# Patient Record
Sex: Female | Born: 1960 | Race: Black or African American | Hispanic: No | Marital: Single | Smoking: Never smoker
Health system: Southern US, Community
[De-identification: ages and names within clinical notes are randomized; demographics above are authoritative.]

## PROBLEM LIST (undated history)

## (undated) DIAGNOSIS — E785 Hyperlipidemia, unspecified: Secondary | ICD-10-CM

## (undated) DIAGNOSIS — N838 Other noninflammatory disorders of ovary, fallopian tube and broad ligament: Secondary | ICD-10-CM

## (undated) DIAGNOSIS — F419 Anxiety disorder, unspecified: Secondary | ICD-10-CM

## (undated) DIAGNOSIS — H409 Unspecified glaucoma: Secondary | ICD-10-CM

## (undated) DIAGNOSIS — I10 Essential (primary) hypertension: Secondary | ICD-10-CM

## (undated) DIAGNOSIS — F32A Depression, unspecified: Secondary | ICD-10-CM

## (undated) DIAGNOSIS — F329 Major depressive disorder, single episode, unspecified: Secondary | ICD-10-CM

## (undated) DIAGNOSIS — B54 Unspecified malaria: Secondary | ICD-10-CM

## (undated) HISTORY — DX: Other noninflammatory disorders of ovary, fallopian tube and broad ligament: N83.8

## (undated) HISTORY — PX: DILATION AND CURETTAGE OF UTERUS: SHX78

## (undated) HISTORY — DX: Essential (primary) hypertension: I10

## (undated) HISTORY — PX: BILATERAL OOPHORECTOMY: SHX1221

## (undated) HISTORY — DX: Hyperlipidemia, unspecified: E78.5

---

## 2008-01-12 ENCOUNTER — Emergency Department (HOSPITAL_COMMUNITY): Admission: EM | Admit: 2008-01-12 | Discharge: 2008-01-13 | Payer: Self-pay | Admitting: Emergency Medicine

## 2008-02-04 ENCOUNTER — Encounter: Payer: Self-pay | Admitting: Internal Medicine

## 2008-02-25 ENCOUNTER — Emergency Department (HOSPITAL_COMMUNITY): Admission: EM | Admit: 2008-02-25 | Discharge: 2008-02-25 | Payer: Self-pay | Admitting: Family Medicine

## 2008-03-02 ENCOUNTER — Emergency Department (HOSPITAL_COMMUNITY): Admission: EM | Admit: 2008-03-02 | Discharge: 2008-03-02 | Payer: Self-pay | Admitting: Psychiatry

## 2008-04-01 ENCOUNTER — Emergency Department (HOSPITAL_COMMUNITY): Admission: EM | Admit: 2008-04-01 | Discharge: 2008-04-01 | Payer: Self-pay | Admitting: Family Medicine

## 2008-09-12 ENCOUNTER — Emergency Department (HOSPITAL_COMMUNITY): Admission: EM | Admit: 2008-09-12 | Discharge: 2008-09-12 | Payer: Self-pay | Admitting: Emergency Medicine

## 2009-02-05 ENCOUNTER — Emergency Department (HOSPITAL_COMMUNITY): Admission: EM | Admit: 2009-02-05 | Discharge: 2009-02-05 | Payer: Self-pay | Admitting: Emergency Medicine

## 2009-05-26 ENCOUNTER — Emergency Department (HOSPITAL_COMMUNITY): Admission: EM | Admit: 2009-05-26 | Discharge: 2009-05-26 | Payer: Self-pay | Admitting: Family Medicine

## 2009-07-27 ENCOUNTER — Emergency Department (HOSPITAL_COMMUNITY): Admission: EM | Admit: 2009-07-27 | Discharge: 2009-07-27 | Payer: Self-pay | Admitting: Emergency Medicine

## 2009-07-30 ENCOUNTER — Ambulatory Visit: Payer: Self-pay | Admitting: Internal Medicine

## 2009-07-30 ENCOUNTER — Telehealth: Payer: Self-pay | Admitting: Internal Medicine

## 2009-07-30 DIAGNOSIS — I10 Essential (primary) hypertension: Secondary | ICD-10-CM | POA: Insufficient documentation

## 2009-07-30 DIAGNOSIS — E119 Type 2 diabetes mellitus without complications: Secondary | ICD-10-CM

## 2009-07-30 DIAGNOSIS — E785 Hyperlipidemia, unspecified: Secondary | ICD-10-CM

## 2009-07-30 LAB — CONVERTED CEMR LAB: Beta hcg, urine, semiquantitative: NEGATIVE

## 2009-07-30 LAB — HM DIABETES FOOT EXAM

## 2009-08-03 ENCOUNTER — Encounter: Payer: Self-pay | Admitting: Internal Medicine

## 2009-08-03 ENCOUNTER — Telehealth: Payer: Self-pay | Admitting: Internal Medicine

## 2009-08-06 ENCOUNTER — Ambulatory Visit: Payer: Self-pay | Admitting: Internal Medicine

## 2009-08-06 DIAGNOSIS — L680 Hirsutism: Secondary | ICD-10-CM

## 2009-08-06 LAB — CONVERTED CEMR LAB
Albumin: 4.5 g/dL (ref 3.5–5.2)
Blood Glucose, Fingerstick: 183
CO2: 25 meq/L (ref 19–32)
Cortisol, Plasma: 6.2 ug/dL
DHEA-SO4: 113 ug/dL (ref 35–430)
Free T4: 1.29 ng/dL (ref 0.80–1.80)
Glucose, Bld: 143 mg/dL — ABNORMAL HIGH (ref 70–99)
Hemoglobin: 14 g/dL (ref 12.0–15.0)
Lymphocytes Relative: 30 % (ref 12–46)
Lymphs Abs: 2.8 10*3/uL (ref 0.7–4.0)
Monocytes Absolute: 0.5 10*3/uL (ref 0.1–1.0)
Monocytes Relative: 5 % (ref 3–12)
Neutro Abs: 6 10*3/uL (ref 1.7–7.7)
Potassium: 4.2 meq/L (ref 3.5–5.3)
RBC: 4.77 M/uL (ref 3.87–5.11)
Sodium: 138 meq/L (ref 135–145)
TSH: 1.838 microintl units/mL (ref 0.350–4.5)
Total Protein: 7.4 g/dL (ref 6.0–8.3)
WBC: 9.6 10*3/uL (ref 4.0–10.5)

## 2009-08-11 ENCOUNTER — Ambulatory Visit (HOSPITAL_COMMUNITY): Admission: RE | Admit: 2009-08-11 | Discharge: 2009-08-11 | Payer: Self-pay | Admitting: Internal Medicine

## 2009-08-14 ENCOUNTER — Ambulatory Visit: Payer: Self-pay | Admitting: Internal Medicine

## 2009-08-14 ENCOUNTER — Other Ambulatory Visit: Payer: Self-pay | Admitting: Dietician

## 2009-08-18 ENCOUNTER — Encounter: Payer: Self-pay | Admitting: Internal Medicine

## 2009-08-18 ENCOUNTER — Telehealth: Payer: Self-pay | Admitting: *Deleted

## 2009-08-20 ENCOUNTER — Telehealth (INDEPENDENT_AMBULATORY_CARE_PROVIDER_SITE_OTHER): Payer: Self-pay | Admitting: *Deleted

## 2009-08-25 ENCOUNTER — Telehealth (INDEPENDENT_AMBULATORY_CARE_PROVIDER_SITE_OTHER): Payer: Self-pay | Admitting: *Deleted

## 2009-09-01 ENCOUNTER — Telehealth: Payer: Self-pay | Admitting: Internal Medicine

## 2009-09-11 ENCOUNTER — Ambulatory Visit: Payer: Self-pay | Admitting: Obstetrics & Gynecology

## 2009-09-11 ENCOUNTER — Encounter: Payer: Self-pay | Admitting: Internal Medicine

## 2009-09-11 LAB — CONVERTED CEMR LAB
ALT: 21 units/L
AST: 18 units/L
Albumin: 4.5 g/dL
Alkaline Phosphatase: 49 units/L
Chlamydia, DNA Probe: NEGATIVE
DHEA-SO4: 113 ug/dL
Free T4: 1.29 ng/dL
GC Probe Amp, Genital: NEGATIVE
MCHC: 34.1 g/dL
Potassium: 4.2 meq/L
RDW: 14.1 %
Sodium: 138 meq/L
Total Protein: 7.4 g/dL

## 2009-09-12 ENCOUNTER — Encounter: Payer: Self-pay | Admitting: Obstetrics & Gynecology

## 2009-09-12 LAB — CONVERTED CEMR LAB
Clue Cells Wet Prep HPF POC: NONE SEEN
Trich, Wet Prep: NONE SEEN
Yeast Wet Prep HPF POC: NONE SEEN

## 2009-09-15 ENCOUNTER — Telehealth: Payer: Self-pay | Admitting: Internal Medicine

## 2009-09-16 ENCOUNTER — Encounter: Payer: Self-pay | Admitting: Internal Medicine

## 2009-10-14 ENCOUNTER — Ambulatory Visit (HOSPITAL_COMMUNITY): Admission: RE | Admit: 2009-10-14 | Discharge: 2009-10-14 | Payer: Self-pay | Admitting: Internal Medicine

## 2009-10-29 ENCOUNTER — Encounter (INDEPENDENT_AMBULATORY_CARE_PROVIDER_SITE_OTHER): Payer: Self-pay | Admitting: Family Medicine

## 2009-10-29 ENCOUNTER — Ambulatory Visit: Payer: Self-pay | Admitting: Obstetrics and Gynecology

## 2009-11-02 DIAGNOSIS — N838 Other noninflammatory disorders of ovary, fallopian tube and broad ligament: Secondary | ICD-10-CM

## 2009-11-02 HISTORY — DX: Other noninflammatory disorders of ovary, fallopian tube and broad ligament: N83.8

## 2009-11-05 ENCOUNTER — Ambulatory Visit (HOSPITAL_COMMUNITY): Admission: RE | Admit: 2009-11-05 | Discharge: 2009-11-05 | Payer: Self-pay | Admitting: Obstetrics and Gynecology

## 2009-11-11 ENCOUNTER — Encounter: Payer: Self-pay | Admitting: Internal Medicine

## 2009-11-11 ENCOUNTER — Telehealth: Payer: Self-pay | Admitting: Internal Medicine

## 2009-11-13 ENCOUNTER — Ambulatory Visit (HOSPITAL_COMMUNITY): Admission: RE | Admit: 2009-11-13 | Discharge: 2009-11-13 | Payer: Self-pay | Admitting: Obstetrics & Gynecology

## 2009-11-13 LAB — HM MAMMOGRAPHY: HM Mammogram: NEGATIVE

## 2009-11-24 ENCOUNTER — Ambulatory Visit: Payer: Self-pay | Admitting: Internal Medicine

## 2009-11-24 ENCOUNTER — Encounter: Payer: Self-pay | Admitting: Internal Medicine

## 2009-11-24 DIAGNOSIS — N9489 Other specified conditions associated with female genital organs and menstrual cycle: Secondary | ICD-10-CM | POA: Insufficient documentation

## 2009-11-24 DIAGNOSIS — H1030 Unspecified acute conjunctivitis, unspecified eye: Secondary | ICD-10-CM | POA: Insufficient documentation

## 2009-11-24 LAB — CONVERTED CEMR LAB: Blood Glucose, Fingerstick: 123

## 2009-11-25 ENCOUNTER — Telehealth: Payer: Self-pay | Admitting: Internal Medicine

## 2009-11-25 LAB — CONVERTED CEMR LAB
Creatinine, Urine: 159.9 mg/dL
HDL: 44 mg/dL (ref >39)
Microalb Creat Ratio: 5.6 mg/g (ref 0.0–30.0)
Microalb, Ur: 0.89 mg/dL (ref 0.00–1.89)

## 2009-12-02 ENCOUNTER — Encounter: Payer: Self-pay | Admitting: Obstetrics & Gynecology

## 2009-12-02 ENCOUNTER — Ambulatory Visit: Payer: Self-pay | Admitting: Obstetrics and Gynecology

## 2009-12-12 ENCOUNTER — Encounter: Payer: Self-pay | Admitting: Obstetrics & Gynecology

## 2009-12-12 LAB — CONVERTED CEMR LAB: FSH: 8.6 milliintl units/mL

## 2009-12-16 ENCOUNTER — Ambulatory Visit: Payer: Self-pay | Admitting: Obstetrics & Gynecology

## 2009-12-16 ENCOUNTER — Encounter: Payer: Self-pay | Admitting: Internal Medicine

## 2009-12-16 LAB — CONVERTED CEMR LAB
Clue Cells Wet Prep HPF POC: NEGATIVE
Estradiol: 18.9 pg/mL
Testosterone: 38.5 ng/dL
WBC, Wet Prep HPF POC: NEGATIVE

## 2009-12-31 ENCOUNTER — Ambulatory Visit: Admission: RE | Admit: 2009-12-31 | Discharge: 2009-12-31 | Payer: Self-pay | Admitting: Gynecologic Oncology

## 2010-01-04 ENCOUNTER — Ambulatory Visit: Payer: Self-pay | Admitting: Obstetrics & Gynecology

## 2010-01-07 ENCOUNTER — Telehealth: Payer: Self-pay | Admitting: *Deleted

## 2010-01-12 ENCOUNTER — Telehealth (INDEPENDENT_AMBULATORY_CARE_PROVIDER_SITE_OTHER): Payer: Self-pay | Admitting: *Deleted

## 2010-01-13 ENCOUNTER — Ambulatory Visit: Payer: Self-pay | Admitting: Internal Medicine

## 2010-01-15 ENCOUNTER — Telehealth: Payer: Self-pay | Admitting: Internal Medicine

## 2010-01-15 ENCOUNTER — Ambulatory Visit: Payer: Self-pay | Admitting: Internal Medicine

## 2010-01-18 ENCOUNTER — Encounter: Payer: Self-pay | Admitting: Internal Medicine

## 2010-01-19 ENCOUNTER — Telehealth: Payer: Self-pay | Admitting: Internal Medicine

## 2010-01-22 ENCOUNTER — Encounter: Payer: Self-pay | Admitting: Internal Medicine

## 2010-01-22 LAB — CONVERTED CEMR LAB
C-Peptide: 3.12 ng/mL
Cortisol, Plasma: 6.2 ug/dL
Prolactin: 6.3 ng/mL
Testosterone: 10 ng/dL

## 2010-01-27 ENCOUNTER — Ambulatory Visit: Payer: Self-pay | Admitting: Obstetrics and Gynecology

## 2010-01-28 ENCOUNTER — Telehealth: Payer: Self-pay | Admitting: Internal Medicine

## 2010-01-28 ENCOUNTER — Encounter: Payer: Self-pay | Admitting: Internal Medicine

## 2010-02-11 ENCOUNTER — Telehealth: Payer: Self-pay | Admitting: *Deleted

## 2010-04-07 ENCOUNTER — Telehealth: Payer: Self-pay | Admitting: Internal Medicine

## 2010-04-25 ENCOUNTER — Encounter: Payer: Self-pay | Admitting: Family Medicine

## 2010-04-29 ENCOUNTER — Telehealth: Payer: Self-pay | Admitting: *Deleted

## 2010-05-04 NOTE — Progress Notes (Signed)
Summary: refill/ hla  Phone Note Refill Request Message from:  Fax from Pharmacy on September 15, 2009 2:09 PM  Refills Requested: Medication #1:  LANTUS SOLOSTAR 100 UNIT/ML SOLN Inject 20 units under the skin every night.   Dosage confirmed as above?Dosage Confirmed   Last Refilled: 5/11 Initial call taken by: Marin Roberts RN,  September 15, 2009 2:10 PM  Follow-up for Phone Call        completed refill, thank you Bexleigh Theriault  Follow-up by: Mliss Sax MD,  September 17, 2009 11:30 PM    Prescriptions: LANTUS SOLOSTAR 100 UNIT/ML SOLN (INSULIN GLARGINE) Inject 20 units under the skin every night.  #1 x 5   Entered and Authorized by:   Mliss Sax MD   Signed by:   Mliss Sax MD on 09/17/2009   Method used:   Faxed to ...       [facility] Department (retail)       [address] [place], [place]  [address]       Ph: [phone]       Fax: [phone]   RxID:   [id number] LANTUS SOLOSTAR 100 UNIT/ML SOLN (INSULIN GLARGINE) Inject 20 units under the skin every night.  #1 x 5   Entered and Authorized by:   Mliss Sax MD   Signed by:   Mliss Sax MD on 09/17/2009   Method used:   Telephoned to ...       [facility] Department (retail)       [address] [place], [place]  [address]       Ph: [phone]       Fax: [phone]   RxID:   [id number]

## 2010-05-04 NOTE — Progress Notes (Signed)
  Phone Note Outgoing Call   Call placed by: Theotis Barrio NT II,  Aug 18, 2009 12:07 PM Call placed to: Patient Details for Reason: GYN APPT Summary of Call: CALLED AND LEFT VOICE MESSAGE FOR PATIENT TO CALL IF SHE H AD QUESTIONS ABOUT THE APPT / JUNE 10, 011 AT 9:15AM / APPT LETTER MAILED TO THE PATIENT. Lela Sturdivant NT II  Aug 18, 2009 12:09 PM

## 2010-05-04 NOTE — Assessment & Plan Note (Signed)
Summary: coming in at 1:30 per Dilpreet Faires/dmr  Is Patient Diabetic? Yes Did you bring your meter with you today? Yes CBG Result 183 Comments after eating a banana o the way here.   Allergies: 1)  ! Morphine 2)  ! Codeine   Complete Medication List: 1)  Metformin Hcl 1000 Mg Tabs (Metformin hcl) .... Take 1 tablet by mouth two times a day 2)  Hydrochlorothiazide 25 Mg Tabs (Hydrochlorothiazide) .... Take 1 tablet by mouth once a day 3)  Lantus Solostar 100 Unit/ml Soln (Insulin glargine) .... Inject 10 units under the skin every night and increase 1 unit each night until fastings blood sugar < 130 mg/dl 4)  Truetrack Test Strp (Glucose blood) .... Use to test blood sugar twice a day- before breakfst and dinner 5)  Lancets 30g Misc (Lancets) .... Use to test blood sugar twice a day 6)  Sure Comfort Pen Needles 31g X 5 Mm Misc (Insulin pen needle) .... Use to inject insulin once a day 7)  Atenolol 50 Mg Tabs (Atenolol) .... Take 1 tablet by mouth once a day  Other Orders: DSMT(Medicare) Individual, 30 Minutes (G0108)  Diabetes Self Management Training  PCP: Mliss Sax MD Date diagnosed with diabetes: 07/03/2009 Diabetes Type: Type 2 insulin Other persons present: no Current smoking Status: never  Diabetes Medications:  Comments: tkaing lantus 11 units at night, metformin gave her diarrhea despite tkaing it with food. She has only been taking 1000 mg/day. Now using her friends;s One Touch Ultra 2 meter and recording results before brekfats and dinner. CBGs are generally higher in PM, range of fasting blood sugars 150s to 180s. 144 yesterday PM was lowest reading. She now has the [employer], [place] he to get supplies there. 25 pen needles provided today for her to be abelt o finichs using what insulin is left in the pen that was given to her last week.   Long Acting  Insulin Type:Lantus  Bedtime Dose: 11    Monitoring Self monitoring blood glucose 2 times a  day Name of Meter  True Track  Recent Episodes of: Requiring Help from another person  Hyperglycemia : No Hypoglycemia: No Severe Hypoglycemia : No      Activity Limitations  Inadequate physical activity Diabetes Disease Process  Discussed today Define diabetes in simple terms: Needs review/assistance   State own type of diabetes: Demonstrates competency    Medications State name-action-dose-duration-side effects-and time to take medication: Needs review/assistanceState appropriate timing of food related to medication: Needs review/assistanceDemonstrates/verbalizes site selection and rotation for injections Needs review/assistanceCorrectly draw up and administer insulin-Byetta-Symlin-glucagon: Demonstrates competencyState insulin adjustment guidelines: Demonstrates competency Nutritional Management Identify what foods most often affect blood glucose: No knowledge    Verbalize importance of controlling food portions: No knowledge   State importance of spacing and not omitting meals and snacks: No knowledge    Monitoring State purpose and frequency of monitoring BG-ketones-HgbA1C  : Needs review/assistance   Perform glucose monitoring/ketone testing and record results correctly: Demonstrates competencyState target blood glucose and HgbA1C goals: Needs review/assistance    Complications State the causes-signs and symptoms and prevention of Hyperglycemia: Needs review/assistanceExplain proper treatment of hyperglycemia: Needs review/assistance   Glucose control and the development/prevention of long-term complications: No knowledge   State benefits-risks-and options for improving blood sugar control: No knowledge    Exercise States importance of exercise: No knowledge   States effect of exercise on blood glucose: No knowledge    Lifestyle changes:Goal setting and Problem solving Develop  strategies to reduce risk factors: No knowledge   Verbalize need for and frequency of health  care follow-up: Needs review/assistance   Identify Family/SO role in managing diabetes: Needs review/assistance    Psychosocial Adjustment Identify how stress affects diabetes & two sources of stress: No knowledge   Name two ways of obtaining support from family/friends: Needs review/assistance   Diabetes Management Education Done: 08/07/2009        Diabetes Self Management Support: has friend also with diabetes, invited her to hae her friend attend sessions with her Follow-up: 1-2  weeks, then monthly until comfortable with her diabetes self care

## 2010-05-04 NOTE — Letter (Signed)
Summary: [facility]   [facility]   Imported By: Margie Billet 08/18/2009 14:53:48  _____________________________________________________________________  External Attachment:    Type:   Image     Comment:   External Document

## 2010-05-04 NOTE — Progress Notes (Signed)
Summary: diabetes support/dmr  Phone Note Call from Patient Call back at Home Phone [phone]   Caller: Patient Summary of Call: patient called to ask if CBg last  night was under 130(hadn't eaten a big lunch) , does she still take her Lantus insulin?  I told her yes- that is why her CBg was in target because she has been taking 20 units lantus daily. That to stay in target range with her blood sugar ( desired) she should continue to take the lantus unless physicain tells her otherwise.

## 2010-05-04 NOTE — Miscellaneous (Signed)
Summary: [facility]   Imported By: Margie Billet 12/24/2009 15:24:59  _____________________________________________________________________  External Attachment:    Type:   Image     Comment:   External Document

## 2010-05-04 NOTE — Progress Notes (Signed)
Summary: nauseated /dmr  Phone Note Call from Patient Call back at [facility] Phone [phone]   Caller: Patient Summary of Call: call left on voicemail after 5pm yesterday. awoke yesterday with nausea, cbg was168- "low", took metformin as usual, had taken 20 cc insulin the night before. has been "'swimming in nausea all day.  please call Initial call taken by: Jamison Neighbor RD,CDE,  Aug 25, 2009 9:21 AM  Follow-up for Phone Call        CDE returned call- left message that I do not think nausea from diabetes. encouragd her to call if still nauseated today.  Follow-up by: Jamison Neighbor RD,CDE,  Aug 25, 2009 9:27 AM  Additional Follow-up for Phone Call Additional follow up Details #1::        Patient left another message sayng sh eis better today. took meidcine wihtout problems and no nausea. made appointment for cde on 09/14/09. Additional Follow-up by: Jamison Neighbor RD,CDE,  Aug 25, 2009 11:25 AM     Appended Document: nauseated /dmr "20cc" insulin is what patient stated meaning her 20 units- this was clarified during phone conversation

## 2010-05-04 NOTE — Progress Notes (Signed)
Summary: phone/gg  Phone Note Call from Patient   Caller: Patient Summary of Call: Pt called in with concerns because she was taken off atenolol  at last visit and started on methyldopa.  She states this was not discussed with her and she wants to remain on  atenolol.  Her HTN is doing well on this med.  She does not feel it will interfer if she does get pregnant. I told her she should come in and talk with the doctor about changing meds and recheck BP.  I don't think she ever got the methyldopa rx filled. Appointment scheduled for next week Initial call taken by: Merrie Roof RN,  January 07, 2010 10:49 AM

## 2010-05-04 NOTE — Progress Notes (Signed)
Summary: phone/gg  Phone Note Call from Patient   Caller: Patient Summary of Call: Pt call with c/o diarrhea. Onset was yesterday.  She had 5 water stools Sunday  and today at 0700, stool just ran out into bed.  No other c/o  She is on metformin ( since march)  which dose was increased 4/28 and on  iinsulin. Can she take pepto-bismol with these meds.  What else can she do? Pt # [id number]  or [phone] Initial call taken by: Merrie Roof RN,  Aug 03, 2009 12:26 PM  Follow-up for Phone Call        I have tried to call her without success but I think she should come in sooner if her diarrhea persists, I would also hold metformin fo now until she is seen in clinic.  thanks  Avari Gelles Follow-up by: Mliss Sax MD,  Aug 03, 2009 4:01 PM     Appended Document: phone/gg Pt informed and will come to clinic this thursday

## 2010-05-04 NOTE — Progress Notes (Signed)
Summary: cannot afford medicine/dmr  Phone Note Outgoing Call   Call placed by: Jamison Neighbor RD,CDE,  July 30, 2009 4:40 PM Summary of Call: patient left message: [facility] prescriptions and it was 200.00. REtunred message nad encourage dhe to get ht Metfomrin an dlopressor if she can afford themand start ASAP> also to hol donthe lantus until I disucss wiht her doctor. The sample we gave her should last until next week.  I Ithought she was going to be given prescriptons to take to Medication assistance program. If she is paying cash- might consider NPH at bedtime instead.will discuss with DR. Aldine Contes.  Follow-up for Phone Call        [facility] called back againand said she can ge the metformin for 4 dollars, but it is 41.00 for 30 days for the blood pressure medicine [facility] she cannot afford this right now- is there a sub to get her by until she can be approved by [employer]- whih she tells me might be a week because of her student status.  will put this on Dr. William Dalton' desktop Follow-up by: Jamison Neighbor RD,CDE,  July 30, 2009 5:17 PM  Additional Follow-up for Phone Call Additional follow up Details #1::        there are plenty of sub but pt does not want to take it due to pregnancy planning and unfortunately in this case options are limited. I will have to discuss this with patient again and let her know that she has to be taking medicines for BP, at least 2 of them and if she wants the ones on $4 list it will have to be Class C or D in terms of pregnancy effects. I will call her to discuss this. Additional Follow-up by: Mliss Sax MD,  July 31, 2009 9:44 AM

## 2010-05-04 NOTE — Progress Notes (Signed)
Summary: Medication  Phone Note Other Incoming   Caller: Diane Daye Summary of Call: Call from Diane Daye-Dr. Leggette's nurse.  Dr. Alen Bleacher has approved for the pt to continue taking her Atenolol.  Pt was told on Friday by D. Daye that Dr. Alen Bleacher had approved the medication. Angelina Ok RN  January 19, 2010 10:04 AM  Initial call taken by: Angelina Ok RN,  January 19, 2010 10:04 AM  Follow-up for Phone Call        Louellen Molder I have already refilled it. thank you iskra Follow-up by: Mliss Sax MD,  January 19, 2010 12:21 PM

## 2010-05-04 NOTE — Progress Notes (Signed)
Summary: refill/gg  Phone Note Refill Request  on November 11, 2009 10:55 AM  Refills Requested: Medication #1:  ATENOLOL 50 MG TABS Take 1 tablet by mouth once a day This med was refilled 4/]29 but never picked up at [facility].  I [facility] wt[facility] only 3 refills.   Method Requested: Electronic Initial call taken by: Merrie Roof RN,  November 11, 2009 10:55 AM

## 2010-05-04 NOTE — Assessment & Plan Note (Signed)
Summary: NEW PATIENT-ER DIAGNOSIS/HIGH BLOOD PRESSURE/DIABETES/CFB   Vital Signs:  Patient profile:   50 year old female Height:      64.75 inches (164.47 cm) Weight:      268.2 pounds (121.91 kg) BMI:     45.14 Temp:     97.9 degrees F (36.61 degrees C) oral Pulse rate:   79 / minute BP sitting:   172 / 105  (right arm)  Vitals Entered By: Stanton Kidney Ditzler RN (July 30, 2009 11:19 AM) Is Patient Diabetic? Yes Did you bring your meter with you today? No Pain Assessment Patient in pain? no      Nutritional Status BMI of > 30 = obese Nutritional Status Detail appetite down CBG Result 236  Have you ever been in a relationship where you felt threatened, hurt or afraid?denies   Does patient need assistance? Functional Status Self care Ambulation Normal Comments New pt to [facility]. Just Dx with high BP and diabetes. Wants to get preg - no GYN dr. 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 IT consultant at [employer].   History of Present Illness: 50 yo female with HTN and DM2 presents today to our clinic as new patient and would like for [place] to be her PCP. She has no concerns at the time. No recent sicknesses or hospitalizaitons. No episodes of chest pain, SOB, palpitations. No specific abdominal or urinary concerns. No recent changes in appetite, weight, sleep patterns, mood.   Her main issue today is desire to get pregnant, she has been trying for several years but has never followed up with BO/Gyn specialist for assistance in the matter. She has never been pregnant and has not had any miscarriges. She has never been on any contraception.   Depression History:      The patient denies a depressed mood most of the day and a diminished interest in her usual daily activities.  The patient denies significant weight loss, significant weight gain, insomnia, hypersomnia, psychomotor agitation, psychomotor retardation, fatigue (loss of energy), feelings of worthlessness (guilt), impaired  concentration (indecisiveness), and recurrent thoughts of death or suicide.        The patient denies that she feels like life is not worth living, denies that she wishes that she were dead, and denies that she has thought about ending her life.         Preventive Screening-Counseling & Management  Alcohol-Tobacco     Smoking Status: never  Caffeine-Diet-Exercise     Does Patient Exercise: yes      Drug Use:  no.    Current Medications (verified): 1)  Metformin Hcl 1000 Mg Tabs (Metformin Hcl) .... Take 1 Tablet By Mouth Two Times A Day 2)  Lopressor Hct 50-25 Mg Tabs (Metoprolol-Hydrochlorothiazide) .... Take 1 Tablet By Mouth Once A Day 3)  Lantus Solostar 100 Unit/ml Soln (Insulin Glargine) .... Inject 10 Units Under The Skin Every Night  Allergies (verified): 1)  ! Morphine 2)  ! Codeine  Past History:  Family History: Last updated: 07/30/2009 no known medical family history  Social History: Last updated: 07/30/2009 Married Alcohol use-no Drug use-no Regular exercise-yes  Risk Factors: Exercise: yes (07/30/2009)  Risk Factors: Smoking Status: never (07/30/2009)  Past Medical History: Diabetes mellitus, type II Hypertension  Family History: Reviewed history and no changes required. no known medical family history  Social History: Reviewed history and no changes required. Married Alcohol use-no Drug use-no Regular exercise-yes Smoking Status:  never Drug Use:  no Does Patient Exercise:  yes  Review of Systems       per HPI  Physical Exam  General:  Well-developed,well-nourished,in no acute distress; alert,appropriate and cooperative throughout examination Lungs:  Normal respiratory effort, chest expands symmetrically. Lungs are clear to auscultation, no crackles or wheezes. Heart:  Normal rate and regular rhythm. S1 and S2 normal without gallop, murmur, click, rub or other extra sounds. Abdomen:  Bowel sounds positive,abdomen soft and non-tender  without masses, organomegaly or hernias noted. Neurologic:  No cranial nerve deficits noted. Station and gait are normal. Plantar reflexes are down-going bilaterally. DTRs are symmetrical throughout. Sensory, motor and coordinative functions appear intact. Psych:  Cognition and judgment appear intact. Alert and cooperative with normal attention span and concentration. No apparent delusions, illusions, hallucinations  Diabetes Management Exam:    Foot Exam (with socks and/or shoes not present):       Sensory-Monofilament:          Left foot: normal          Right foot: normal   Impression & Recommendations:  Problem # 1:  DM (ICD-250.00) Uncontrolled and I think that the main problem here was lack of PCP. Pt has not been clear on who was managing her DM but tells me she ahs been getting perscription from doctor at [facility] [facility] where she goes to school. Her A1C today is 9.8 and we have no comparison for this. The best we can do at this point is to optimize the regimen, follow u pin next week to ensure that she has no hypo/hyperglycemic event, and let her see Jamison Neighbor for additional education. She has been on Metformin 850mg  two times a day dosing but I will increase this to 1000 mg two times a day and will start her on low dose lantus (pen, sample from clinic given). Lupita Leash has agreed to see her today and provide education on self insulin injections and pt seems to be comfortable with this. She is also going to see Jaynee Eagles to get paperwork done for insurance purposes. Since pt plans pregnancy, I will refer her to [facility] specialist for further management. I have explained to the patient risks of first pregnancy given her age and comorbidities, especially noncontrolled ones. She is aware and that might be an issue as well since she is not willing to take medicines in any drug class C, only A or B (which is safe in pregnancy). I have discussed this withher as well and will re-emphasize on future visits as well but  I hope this is also addressed with OB doctor. My advice to patient was to get the medical problems under control first and then make other plans since uncontrolled HTN and DM are exceptionally dangerous conditions for fetus and for mother as well. Pregnancy test today is negative.  Orders: T- Capillary Blood Glucose (16109) T-Hgb A1C (in-house) (60454UJ) T-Urine Pregnancy (in -house) (81191) Diabetic Clinic Referral (Diabetic)  Reviewed HgBA1c results: 9.8 (07/30/2009)  Her updated medication list for this problem includes:    Metformin Hcl 1000 Mg Tabs (Metformin hcl) .Marland Kitchen... Take 1 tablet by mouth two times a day  Problem # 2:  PREGNANCY, HIGH RISK (ICD-V23.9) Negative, referral made to OB.  Orders: Obstetric Referral (Obstetric)  Problem # 3:  HYPERTENSION, UNCONTROLLED (ICD-401.9) Uncontrolled, she wants to stop Lisinopril and I think that is reasonable if she is planning pregnancy but she need multi-drug regimen for BP control. I will give her combo tablet and I have told her that Loressor is Class  C category but it is important to achiece BP control and Lopressor is preferred ove lisinopril in cases where pregnancy is planned. I will see her next week to check BP and CBG and will readjust the regimen.  BP today: 172/105  Her updated medication list for this problem includes:    Lopressor Hct 50-25 Mg Tabs (Metoprolol-hydrochlorothiazide) .Marland Kitchen... Take 1 tablet by mouth once a day  Problem # 4:  HYPERLIPIDEMIA (ICD-272.4) Will hold of on testing since regimen involves statins and pt is not willing to start the medication. I recommend at least testing on her next visit.   Complete Medication List: 1)  Metformin Hcl 1000 Mg Tabs (Metformin hcl) .... Take 1 tablet by mouth two times a day 2)  Lopressor Hct 50-25 Mg Tabs (Metoprolol-hydrochlorothiazide) .... Take 1 tablet by mouth once a day 3)  Lantus Solostar 100 Unit/ml Soln (Insulin glargine) .... Inject 10 units under the skin every  night  Patient Instructions: 1)  Please schedule a follow-up appointment in 3 months. 2)  Please check your blood pressure regularly, if it is >170 please call clinic at [phone] 3)  Please check your sugar levels regularly and remember to bring the meter with you to the next clinic appointment, if the sugars are > 350 or < 60 please call us at [phone] Prescriptions: LANTUS SOLOSTAR 100 UNIT/ML SOLN (INSULIN GLARGINE) Inject 10 units under the skin every night  #1 x 3   Entered and Authorized by:   Mliss Sax MD   Signed by:   Mliss Sax MD on 07/30/2009   Method used:   Electronically to        [facility] [phone]* (retail)       [address]       [place], [place]  [address]       Ph: [phone]       Fax: [phone]   RxID:   [id number] LOPRESSOR HCT 50-25 MG TABS (METOPROLOL-HYDROCHLOROTHIAZIDE) Take 1 tablet by mouth once a day  #30 x 3   Entered and Authorized by:   Mliss Sax MD   Signed by:   Mliss Sax MD on 07/30/2009   Method used:   Electronically to        [facility] [phone]* (retail)       [address]       [place], [place]  [address]       Ph: [phone]       Fax: [phone]   RxID:   [id number] METFORMIN HCL 1000 MG TABS (METFORMIN HCL) Take 1 tablet by mouth two times a day  #60 x 3   Entered and Authorized by:   Mliss Sax MD   Signed by:   Mliss Sax MD on 07/30/2009   Method used:   Electronically to        [facility] [phone]* (retail)       [address]       [place], [place]  [address]       Ph: [phone]       Fax: [phone]   RxID:   [id number]    Diabetic Foot Exam Last Podiatry Exam Date: 07/30/2009  Foot Inspection Is there a history of a foot ulcer?              No Is there a foot ulcer now?              No Is there swelling or an abnormal  foot shape?          No Are the toenails long?                No Are the toenails thick?                No Are the toenails ingrown?               No Is there heavy callous build-up?              No Is there pain in the calf muscle (Intermittent claudication) when walking?    NoIs there a claw toe deformity?              No Is there elevated skin temperature?            No Is there limited ankle dorsiflexion?            No Is there foot or ankle muscle weakness?            No  Diabetic Foot Care Education Patient educated on appropriate care of diabetic feet.   High Risk Feet? No   10-g (5.07) Semmes-Weinstein Monofilament Test           Right Foot          Left Foot Visual Inspection     normal           normal Test Control      normal         normal Site 1         normal         normal Site 2         normal         normal Site 3         normal         normal Site 4         normal         normal Site 5         normal         normal Site 6         normal         normal Site 7         normal         normal Site 8         normal         normal Site 9         normal         normal Site 10         normal         normal  Impression      normal         normal   Prevention & Chronic Care Immunizations   Influenza vaccine: Not documented   Influenza vaccine deferral: Not indicated  (07/30/2009)    Tetanus booster: Not documented   Td booster deferral: Not indicated  (07/30/2009)    Pneumococcal vaccine: Not documented  Other Screening   Pap smear: Not documented   Pap smear action/deferral: Deferred-3 yr interval  (07/30/2009)    Mammogram: Not documented   Mammogram action/deferral: Deferred-2 yr interval  (07/30/2009)   Smoking status: never  (07/30/2009)  Diabetes Mellitus   HgbA1C: 9.8  (07/30/2009)    Eye exam: Not documented   Diabetic eye exam action/deferral: Not indicated  (07/30/2009)    Foot exam: yes  (07/30/2009)  Foot exam action/deferral: Do today   High risk foot: No  (07/30/2009)   Foot care education: Done  (07/30/2009)    Urine microalbumin/creatinine ratio: Not documented     Diabetes flowsheet reviewed?: Yes   Progress toward A1C goal: Unchanged  Lipids   Total Cholesterol: Not documented   LDL: Not documented   LDL Direct: Not documented   HDL: Not documented   Triglycerides: Not documented    SGOT (AST): Not documented   SGPT (ALT): Not documented   Alkaline phosphatase: Not documented   Total bilirubin: Not documented    Lipid flowsheet reviewed?: Yes   Progress toward LDL goal: At goal  Hypertension   Last Blood Pressure: 172 / 105  (07/30/2009)   Serum creatinine: Not documented   BMP action: Not indicated   Serum potassium Not documented    Hypertension flowsheet reviewed?: Yes   Progress toward BP goal: Deteriorated  Self-Management Support :   Personal Goals (by the next clinic visit) :     Personal A1C goal: 7  (07/30/2009)     Personal blood pressure goal: 130/80  (07/30/2009)     Personal LDL goal: 100  (07/30/2009)    Patient will work on the following items until the next clinic visit to reach self-care goals:     Medications and monitoring: take my medicines every day, check my blood pressure, bring all of my medications to every visit  (07/30/2009)     Eating: drink diet soda or water instead of juice or soda, eat more vegetables, use fresh or frozen vegetables, eat foods that are low in salt, eat baked foods instead of fried foods, eat fruit for snacks and desserts, limit or avoid alcohol  (07/30/2009)     Activity: take a 30 minute walk every day, take the stairs instead of the elevator, park at the far end of the parking lot  (07/30/2009)    Diabetes self-management support: Written self-care plan, Education handout, Resources for patients handout  (07/30/2009)   Diabetes care plan printed   Diabetes education handout printed   Referred for diabetes self-mgmt training.    Hypertension self-management support: Written self-care plan, Resources for patients handout  (07/30/2009)   Hypertension self-care plan printed.    Lipid  self-management support: Written self-care plan, Resources for patients handout  (07/30/2009)   Lipid self-care plan printed.      Resource handout printed.   Nursing Instructions: Diabetic foot exam today   Laboratory Results   Urine Tests      Urine HCG: negative Comments: specific gravity  1.024  Blood Tests   Date/Time Received: July 30, 2009 11:42 AM Date/Time Reported: Alric Quan  July 30, 2009 11:42 AM   HGBA1C: 9.8%   (Normal Range: Non-Diabetic - 3-6%   Control Diabetic - 6-8%) CBG Random:: 236mg /dL      Laboratory Results   Urine Tests  Date/Time Received: .Cynda Familia [facility])  July 30, 2009 12:07 PM  Date/Time Reported: .Cynda Familia [facility])  July 30, 2009 12:07 PM     Urine HCG: negative Comments: specific gravity  1.024  Blood Tests     HGBA1C: 9.8%   (Normal Range: Non-Diabetic - 3-6%   Control Diabetic - 6-8%) CBG Random:: 236

## 2010-05-04 NOTE — Progress Notes (Signed)
Summary: testing supplies/dmr  Phone Note Outgoing Call   Call placed by: Jamison Neighbor RD,CDE,  July 30, 2009 3:18 PM Summary of Call: needs testing supply called or faxed to [employer]. please specify if you want me to send or if nurse will do. thanks.  Follow-up for Phone Call        completed refill, thank you Iskra  Follow-up by: Mliss Sax MD,  July 31, 2009 1:37 PM    New/Updated Medications: HYDROCHLOROTHIAZIDE 25 MG TABS (HYDROCHLOROTHIAZIDE) Take 1 tablet by mouth once a day ATENOLOL 50 MG TABS (ATENOLOL) Take 1 tablet by mouth once a day Prescriptions: SURE COMFORT PEN NEEDLES 31G X 5 MM MISC (INSULIN PEN NEEDLE) use to inject insulin once a day  #1 box x 11   Entered and Authorized by:   Mliss Sax MD   Signed by:   Mliss Sax MD on 07/31/2009   Method used:   Faxed to ...       [facility] Department (retail)       [address] [place], [place]  [address]       Ph: [phone]       Fax: [phone]   RxID:   [id number] METFORMIN HCL 1000 MG TABS (METFORMIN HCL) Take 1 tablet by mouth two times a day  #60 x 3   Entered and Authorized by:   Mliss Sax MD   Signed by:   Mliss Sax MD on 07/31/2009   Method used:   Faxed to ...       [facility] Department (retail)       [address] [place], [place]  [address]       Ph: [phone]       Fax: [phone]   RxID:   [id number] LANTUS SOLOSTAR 100 UNIT/ML SOLN (INSULIN GLARGINE) Inject 10 units under the skin every night and increase 1 unit each night until fastings blood sugar < 130 mg/dl  #0 x 0   Entered and Authorized by:   Mliss Sax MD   Signed by:   Mliss Sax MD on 07/31/2009   Method used:   Faxed to ...       [facility] Department (retail)       [address] [place], [place]  [address]       Ph: [phone]       Fax: [phone]   RxID:   [id number] ATENOLOL 50 MG TABS (ATENOLOL) Take 1 tablet by  mouth once a day  #30 x 5   Entered and Authorized by:   Mliss Sax MD   Signed by:   Mliss Sax MD on 07/31/2009   Method used:   Faxed to ...       [facility] Department (retail)       [address] [place], [place]  [address]       Ph: [phone]       Fax: [phone]   RxID:   [id number] HYDROCHLOROTHIAZIDE 25 MG TABS (HYDROCHLOROTHIAZIDE) Take 1 tablet by mouth once a day  #30 x 5   Entered and Authorized by:   Mliss Sax MD   Signed by:   Mliss Sax MD on 07/31/2009   Method used:   Faxed to ...       [facility] Department (retail)  [address] [place], [place]  [address]       Ph: [phone]       Fax: [phone]   RxID:   [id number] LANCETS 30G  MISC (LANCETS) use to test blood sugar twice a day  #100 x 11   Entered and Authorized by:   Mliss Sax MD   Signed by:   Mliss Sax MD on 07/31/2009   Method used:   Faxed to ...       [facility] Department (retail)       [address] [place], [place]  [address]       Ph: [phone]       Fax: [phone]   RxID:   [id number] TRUETRACK TEST  STRP (GLUCOSE BLOOD) use to test blood sugar twice a day- before breakfst and dinner  #100 x 11   Entered and Authorized by:   Mliss Sax MD   Signed by:   Mliss Sax MD on 07/31/2009   Method used:   Faxed to ...       [facility] Department (retail)       [address] [place], [place]  [address]       Ph: [phone]       Fax: [phone]   RxID:   [id number]

## 2010-05-04 NOTE — Progress Notes (Signed)
Summary: refill/gg  Phone Note Refill Request  on January 15, 2010 10:02 AM  Refills Requested: Medication #1:  METHYLDOPA 250 MG TABS Take 1 tablet by mouth two times a day Pt states Dr Penne Lash her GYN wants her to stay on Atenolol !!  Pt is out and wants refill of atenolol not methydopa.  THis is per patient.   Method Requested: Electronic Initial call taken by: Merrie Roof RN,  January 15, 2010 10:02 AM Summary of Call: Pt    Follow-up for Phone Call        I would need some records from them and not sure why they did not send [place] any records already. Would that be possible by any chance? Iskra Thanks Follow-up by: Mliss Sax MD,  January 15, 2010 10:08 AM  Additional Follow-up for Phone Call Additional follow up Details #1::        Sent for records. Merrie Roof RN  January 15, 2010 11:51 AM      Appended Document: refill/gg Records received and placed in Dr Clabe Seal box.

## 2010-05-04 NOTE — Miscellaneous (Signed)
  Clinical Lists Changes  Observations: Added new observation of MAMMO DUE: 11/2010 (01/22/2010 9:46) Added new observation of MAMMOGRAM: No specific mammographic evidence of malignancy.  No significant changes compared to previous study.  Assessment: BIRADS 1.  (11/13/2009 9:47)      Mammogram  Procedure date:  11/13/2009  Findings:      No specific mammographic evidence of malignancy.  No significant changes compared to previous study.  Assessment: BIRADS 1.   Procedures Next Due Date:    Mammogram: 11/2010   Mammogram  Procedure date:  11/13/2009  Findings:      No specific mammographic evidence of malignancy.  No significant changes compared to previous study.  Assessment: BIRADS 1.   Procedures Next Due Date:    Mammogram: 11/2010

## 2010-05-04 NOTE — Letter (Signed)
Summary: [facility]  [facility]   Imported By: Margie Billet 08/06/2009 11:53:57  _____________________________________________________________________  External Attachment:    Type:   Image     Comment:   External Document

## 2010-05-04 NOTE — Progress Notes (Signed)
Summary: diabetes support/dmr  Phone Note Call from Patient Call back at Home Phone [phone]   Caller: Patient Summary of Call: left a message about having bubbles in lantus pen, please call. advised patient to not store pen with needle attached in between injections ( she was leaving it on) also to use the last 2 doses that contained air-  1-she could try to remove the air by doing airshots- explained to patient, 2- she  could discard pen with remianing insulin in it 3- could inject last 2 doses wiht pen completely upside down so air bubbles would be at the top and insulin at the bottom.   support provided and patient encouraged to call as needed. Initial call taken by: Jamison Neighbor RD,CDE,  January 12, 2010 2:11 PM

## 2010-05-04 NOTE — Progress Notes (Signed)
Summary: refill/gg  Phone Note Refill Request  on January 15, 2010 11:54 AM  POLY-DEX 3.5-10000-0.1 SUSP [facility]) apply one drop to each eye q 8 hrs for 5-7days  #1 x 0  This was ordered on 8/23   and pt never picked up.  She is requesting this med.  Will you order?  Left eye irritation   Method Requested: Electronic Initial call taken by: Merrie Roof RN,  January 15, 2010 11:55 AM  Follow-up for Phone Call        completed refill, thank you Iskra  Follow-up by: Mliss Sax MD,  January 15, 2010 12:02 PM    New/Updated Medications: MAXITROL 3.5-10000-0.1 SUSP [facility]) apply one drop to each eye q 8 hrs for 5-7days Prescriptions: MAXITROL 3.5-10000-0.1 SUSP (NEOMYCIN-POLYMYXIN-DEXAMETH) apply one drop to each eye q 8 hrs for 5-7days  #1 x 0   Entered and Authorized by:   Mliss Sax MD   Signed by:   Mliss Sax MD on 01/15/2010   Method used:   Electronically to        [facility] [phone]* (retail)       [address]       [place], [place]  [address]       Ph: [phone]       Fax: [phone]   RxID:   [id number]

## 2010-05-04 NOTE — Assessment & Plan Note (Signed)
Summary: adjust meds/gg   pcp magick   Vital Signs:  Patient profile:   50 year old female Height:      64.75 inches (164.47 cm) Weight:      264.7 pounds (120.32 kg) BMI:     44.55 Temp:     98.9 degrees F (37.17 degrees C) oral Pulse rate:   70 / minute BP sitting:   174 / 102  (right arm) Cuff size:   large  Vitals Entered By: Theotis Barrio NT II (Aug 06, 2009 2:20 PM) CC: MEDICATION ADJUSTMENT  Is Patient Diabetic? No Pain Assessment Patient in pain? no      Nutritional Status BMI of > 30 = obese  Have you ever been in a relationship where you felt threatened, hurt or afraid?No   Does patient need assistance? Functional Status Self care, Social activities Ambulation Normal Comments MEDICATION ADJUTMENT   Primary Care Provider:  Mliss Sax MD  CC:  MEDICATION ADJUSTMENT .  History of Present Illness: 50 yo female with HTN and DM2 presents to the clinic for a follow up. Patient reports that she was started on Metformin in march 2011 and reports that she has had diarrhea about 1 week after starting the metformin. She has been having diarrhea since march. Her metformin was increased to 1000 mg two times a day but was told to take once a day. She reports that her diarrhea is a whole lot better with once a day metformin and is having normal BM.  She reports compliance to all her medications. She denies any other complaints.       Preventive Screening-Counseling & Management  Alcohol-Tobacco     Smoking Status: never  Caffeine-Diet-Exercise     Does Patient Exercise: yes  Problems Prior to Update: 1)  Hyperlipidemia  (ICD-272.4) 2)  Hypertension  (ICD-401.9) 3)  Diabetes Mellitus, Type II  (ICD-250.00) 4)  Dm  (ICD-250.00) 5)  Hypertension, Uncontrolled  (ICD-401.9) 6)  Pregnancy, High Risk  (ICD-V23.9)  Medications Prior to Update: 1)  Metformin Hcl 1000 Mg Tabs (Metformin Hcl) .... Take 1 Tablet By Mouth Two Times A Day 2)  Hydrochlorothiazide 25 Mg  Tabs (Hydrochlorothiazide) .... Take 1 Tablet By Mouth Once A Day 3)  Lantus Solostar 100 Unit/ml Soln (Insulin Glargine) .... Inject 10 Units Under The Skin Every Night and Increase 1 Unit Each Night Until Fastings Blood Sugar < 130 Mg/dl 4)  Truetrack Test  Strp (Glucose Blood) .... Use To Test Blood Sugar Twice A Day- Before Breakfst and Dinner 5)  Lancets 30g  Misc (Lancets) .... Use To Test Blood Sugar Twice A Day 6)  Sure Comfort Pen Needles 31g X 5 Mm Misc (Insulin Pen Needle) .... Use To Inject Insulin Once A Day 7)  Atenolol 50 Mg Tabs (Atenolol) .... Take 1 Tablet By Mouth Once A Day  Current Medications (verified): 1)  Metformin Hcl 1000 Mg Tabs (Metformin Hcl) .... Take 1 Tablet By Mouth Two Times A Day 2)  Hydrochlorothiazide 25 Mg Tabs (Hydrochlorothiazide) .... Take 1 Tablet By Mouth Once A Day 3)  Lantus Solostar 100 Unit/ml Soln (Insulin Glargine) .... Inject 10 Units Under The Skin Every Night and Increase 1 Unit Each Night Until Fastings Blood Sugar < 130 Mg/dl 4)  Truetrack Test  Strp (Glucose Blood) .... Use To Test Blood Sugar Twice A Day- Before Breakfst and Dinner 5)  Lancets 30g  Misc (Lancets) .... Use To Test Blood Sugar Twice A Day 6)  Sure Comfort  Pen Needles 31g X 5 Mm Misc (Insulin Pen Needle) .... Use To Inject Insulin Once A Day 7)  Atenolol 50 Mg Tabs (Atenolol) .... Take 1 Tablet By Mouth Once A Day  Allergies (verified): 1)  ! Morphine 2)  ! Codeine  Past History:  Family History: Last updated: 07/30/2009 no known medical family history  Social History: Last updated: 07/30/2009 Married Alcohol use-no Drug use-no Regular exercise-yes  Risk Factors: Exercise: yes (08/06/2009)  Risk Factors: Smoking Status: never (08/06/2009)  Past Medical History: Reviewed history from 07/30/2009 and no changes required. Diabetes mellitus, type II Hypertension  Social History: Reviewed history from 07/30/2009 and no changes required. Married Alcohol  use-no Drug use-no Regular exercise-yes  Review of Systems      See HPI  Physical Exam  General:  Morbidly obese, NAD.Female pattern hair over the face. Frontal balding noted. Central obesity + Head:  normocephalic and atraumatic.   Mouth:  Oral mucosa and oropharynx without lesions or exudates.  Teeth in good repair. Neck:  thick neck, no palpable thyroids. Lungs:  Normal respiratory effort, chest expands symmetrically. Lungs are clear to auscultation, no crackles or wheezes. Heart:  Normal rate and regular rhythm. S1 and S2 normal without gallop, murmur, click, rub or other extra sounds. Abdomen:  Bowel sounds positive Pulses:  R radial normal.   Extremities:  No clubbing, cyanosis, edema, or deformity noted with normal full range of motion of all joints.   Neurologic:  alert & oriented X3, strength normal in all extremities, and gait normal.     Impression & Recommendations:  Problem # 1:  DIABETES MELLITUS, TYPE II (ICD-250.00)  Reports twice a day metformin was causing her diarrhea. She seems to be tolerating once a day metformin well. Will leave her on once daily metformin. Patient is currently taking 11 units of lantus. Her fasting CBG are [id number], 198. Will increase her Lantus to 15 units a day. Will follow up in a month. Her updated medication list for this problem includes:    Metformin Hcl 1000 Mg Tabs (Metformin hcl) .Marland Kitchen... Take 1 tablet by mouth once a day    Lantus Solostar 100 Unit/ml Soln (Insulin glargine) ..... Inject 15 units under the skin every night.Orders: T-Comprehensive Metabolic Panel [phone]) T-FSH [phone]) T-LH [phone]) T-TSH [phone]) T-Testosterone; Total [phone]) T-T4, Free [phone]) T- * Misc. Laboratory test [phone]) T-Ultrasound Abdominal, Complete [phone]) T-C-Peptide [phone]) T-Dehydroepiandrosterone-Sulfate (DHEA S) [phone]) T-Prolactin [phone]) T-Cortisol Total [phone]) T-CBC w/Diff  [phone])  Problem # 2:  HYPERTENSION (ICD-401.9) BP still running high. Repeat Manual BP is 158/98. No ACEI given that she is trying to get pregnant. Will increase her HCTZ to 50 once daily. Advised her to discuss with her Ob regarding the medicines that she is on for HTN.  The following medications were removed from the medication list:    Hydrochlorothiazide 25 Mg Tabs (Hydrochlorothiazide) .Marland Kitchen... Take 1 tablet by mouth once a day Her updated medication list for this problem includes:    Atenolol 50 Mg Tabs (Atenolol) .Marland Kitchen... Take 1 tablet by mouth once a day    Hydrochlorothiazide 50 Mg Tabs (Hydrochlorothiazide) .Marland Kitchen... Take 1 tablet by mouth once a day  BP today: 174/102 Prior BP: 172/105 (07/30/2009)  Problem # 3:  PREGNANCY, HIGH RISK (ICD-V23.9) Hasn't had babies so far. See my assessment on Hirsutism.  Problem # 4:  HIRSUTISM (ICD-704.1)  She has a lot of worrying features on physical exam. First of all she hasn't had period for many years and she  complains of female pattern hair growth on her face, female pattern frontal balding, central obesity and a new onset diabetes requiring insulin. She reports abnormal menstrual cycles at this age. She does fulfill diagnostic criteria for PCOS ( Cutaneous signs of hyperandrogenism (eg, hirsutism, severe acne, and/or pattern alopecia, Menstrual irregularity (eg, oligo- or amenorrhea, or irregular bleeding),  Obesity and insulin resistance. Will get an [place] abd/pelvis to rule out ovarian/adrenal pathology. Will check the following harmones levels to R/O other explanations. Will follow up next week.   Orders: T-Comprehensive Metabolic Panel [phone]) T-FSH [phone]) T-LH [phone]) T-TSH [phone]) T-Testosterone; Total [phone]) T-T4, Free [phone]) T- * Misc. Laboratory test [phone]) T-Ultrasound Abdominal, Complete [phone]) T-C-Peptide [phone]) T-Dehydroepiandrosterone-Sulfate (DHEA S) [phone]) T-Prolactin  [phone]) T-Cortisol Total [phone]) T-CBC w/Diff [phone])   Orders: T-Comprehensive Metabolic Panel [id number]) T-FSH [phone]) T-LH [phone]) T-TSH [phone]) T-Testosterone; Total [phone]) T-T4, Free [phone]) T- * Misc. Laboratory test [phone]) T-Ultrasound Abdominal, Complete [phone]) T-C-Peptide [phone]) T-Dehydroepiandrosterone-Sulfate (DHEA S) [phone]) T-Prolactin [phone]) T-Cortisol Total [phone]) T-CBC w/Diff [phone])    Complete Medication List: 1)  Metformin Hcl 1000 Mg Tabs (Metformin hcl) .... Take 1 tablet by mouth once a day 2)  Lantus Solostar 100 Unit/ml Soln (Insulin glargine) .... Inject 15 units under the skin every night. 3)  Truetrack Test Strp (Glucose blood) .... Use to test blood sugar twice a day- before breakfst and dinner 4)  Lancets 30g Misc (Lancets) .... Use to test blood sugar twice a day 5)  Sure Comfort Pen Needles 31g X 5 Mm Misc (Insulin pen needle) .... Use to inject insulin once a day 6)  Atenolol 50 Mg Tabs (Atenolol) .... Take 1 tablet by mouth once a day 7)  Hydrochlorothiazide 50 Mg Tabs (Hydrochlorothiazide) .... Take 1 tablet by mouth once a day T-Comprehensive Metabolic Panel [phone]) T-FSH [phone]) T-LH [phone]) T-TSH [phone]) T-Testosterone; Total [phone]) T-T4, Free [phone]) T- * Misc. Laboratory test [phone]) T-Ultrasound Abdominal, Complete [phone]) T-C-Peptide [phone]) T-Dehydroepiandrosterone-Sulfate (DHEA S) [phone]) T-Prolactin [phone]) T-Cortisol Total [phone]) T-CBC w/Diff [phone])  Patient Instructions: 1)  Please schedule a follow-up appointment on May 13th, 2011. 2)  Take all your medications as advised below. Prescriptions: LANTUS SOLOSTAR 100 UNIT/ML SOLN (INSULIN GLARGINE) Inject 15 units under the skin every night.  #0 x 0   Entered and Authorized by:   Blondell Reveal MD   Signed by:   Blondell Reveal MD on 08/06/2009   Method used:   Print then Give to Patient   RxID:   [id number] HYDROCHLOROTHIAZIDE 50 MG TABS (HYDROCHLOROTHIAZIDE) Take 1 tablet by mouth once a day  #30 x 5   Entered and Authorized by:   Blondell Reveal MD   Signed by:   Blondell Reveal MD on 08/06/2009   Method used:   Print then Give to Patient   RxID:   [id number]  Process Orders Check Orders Results:     [facility]: ABN not required for this insurance Tests Sent for requisitioning (Aug 06, 2009 3:48 PM):     08/06/2009: [facility] -- T-Comprehensive Metabolic Panel [[id number]] (signed)     08/06/2009: [facility] -- T-FSH [phone] (signed)     08/06/2009: [facility] -- T-LH [phone] (signed)     08/06/2009: [facility] -- T-TSH [phone] (signed)     08/06/2009: [facility] -- T-Testosterone; Total [phone] (signed)     08/06/2009: [facility] -- T-T4, [place] [[id number]] (signed)     08/06/2009: [facility] -- T- *  Misc. Laboratory test [phone] (signed)     08/06/2009: [facility] -- T-C-Peptide [[id number]] (signed)     08/06/2009: [facility] -- T-Dehydroepiandrosterone-Sulfate (DHEA S) [[id number]] (signed)     08/06/2009: [facility] -- T-Prolactin [phone] (signed)     08/06/2009: [facility] -- T-Cortisol Total [[id number]] (signed)     08/06/2009: [facility] -- T-CBC w/Diff [[id number]] (signed)    Prevention & Chronic Care Immunizations   Influenza vaccine: Not documented   Influenza vaccine deferral: Not indicated  (07/30/2009)    Tetanus booster: Not documented   Td booster deferral: Not indicated  (07/30/2009)    Pneumococcal vaccine: Not documented  Other Screening   Pap smear: Not documented   Pap smear action/deferral: Deferred-3 yr  interval  (07/30/2009)    Mammogram: Not documented   Mammogram action/deferral: Deferred-2 yr interval  (07/30/2009)   Smoking status: never  (08/06/2009)  Diabetes Mellitus   HgbA1C: 9.8  (07/30/2009)    Eye exam: Not documented   Diabetic eye exam action/deferral: Not indicated  (07/30/2009)    Foot exam: yes  (07/30/2009)   Foot exam action/deferral: Do today   High risk foot: No  (07/30/2009)   Foot care education: Done  (07/30/2009)    Urine microalbumin/creatinine ratio: Not documented  Lipids   Total Cholesterol: Not documented   LDL: Not documented   LDL Direct: Not documented   HDL: Not documented   Triglycerides: Not documented    SGOT (AST): Not documented   SGPT (ALT): Not documented CMP ordered    Alkaline phosphatase: Not documented   Total bilirubin: Not documented  Hypertension   Last Blood Pressure: 174 / 102  (08/06/2009)   Serum creatinine: Not documented   BMP action: Not indicated   Serum potassium Not documented CMP ordered   Self-Management Support :   Personal Goals (by the next clinic visit) :     Personal A1C goal: 7  (07/30/2009)     Personal blood pressure goal: 130/80  (07/30/2009)     Personal LDL goal: 100  (07/30/2009)    Patient will work on the following items until the next clinic visit to reach self-care goals:     Medications and monitoring: take my medicines every day, check my blood sugar, bring all of my medications to every visit, examine my feet every day  (08/06/2009)     Eating: drink diet soda or water instead of juice or soda, eat more vegetables, use fresh or frozen vegetables, eat foods that are low in salt, eat baked foods instead of fried foods, eat fruit for snacks and desserts, limit or avoid alcohol  (08/06/2009)     Activity: join a walking program  (08/06/2009)    Diabetes self-management support: Written self-care plan, Education handout, Resources for patients handout  (07/30/2009)   Last diabetes  self-management training by diabetes educator: 07/30/2009    Hypertension self-management support: Written self-care plan, Resources for patients handout  (07/30/2009)    Lipid self-management support: Written self-care plan, Resources for patients handout  (07/30/2009)     Self-management comments: PATIENT STATES SHE HAS JOINED A GYM     Impression & Recommendations:   Her updated medication list for this problem includes:    Metformin Hcl 1000 Mg Tabs (Metformin hcl) .Marland Kitchen... Take 1 tablet by mouth once a day    Lantus Solostar 100 Unit/ml Soln (Insulin glargine) ..... Inject 15 units under the skin every night.  Orders: T-Comprehensive Metabolic Panel [phone]) T-FSH [phone]) T-LH [id number]) T-TSH [phone]) T-Testosterone; Total [phone]) T-T4, Free [phone]) T- * Misc. Laboratory test [phone]) T-Ultrasound Abdominal, Complete [phone]) T-C-Peptide [phone]) T-Dehydroepiandrosterone-Sulfate (DHEA S) [phone]) T-Prolactin [phone]) T-Cortisol Total [phone]) T-CBC w/Diff [phone])                 The following medications were removed from the medication list:    Hydrochlorothiazide 25 Mg Tabs (Hydrochlorothiazide) .Marland Kitchen... Take 1 tablet by mouth once a day  Her updated medication list for this problem includes:    Atenolol 50 Mg Tabs (Atenolol) .Marland Kitchen... Take 1 tablet by mouth once a day    Hydrochlorothiazide 50 Mg Tabs (Hydrochlorothiazide) .Marland Kitchen... Take 1 tablet by mouth once a day  The following medications were removed from the medication list:    Hydrochlorothiazide 25 Mg Tabs (Hydrochlorothiazide) .Marland Kitchen... Take 1 tablet by mouth once a day  Her updated medication list for this problem includes:    Atenolol 50 Mg Tabs (Atenolol) .Marland Kitchen... Take 1 tablet by mouth once a day    Hydrochlorothiazide 50 Mg Tabs (Hydrochlorothiazide) .Marland Kitchen... Take 1 tablet by mouth once a  day                Orders: T-Comprehensive Metabolic Panel [phone]) T-FSH [phone]) T-LH [phone]) T-TSH [phone]) T-Testosterone; Total [phone]) T-T4, Free [phone]) T- * Misc. Laboratory test [phone]) T-Ultrasound Abdominal, Complete [phone]) T-C-Peptide [phone]) T-Dehydroepiandrosterone-Sulfate (DHEA S) [phone]) T-Prolactin [phone]) T-Cortisol Total [phone]) T-CBC w/Diff [phone])                Complete Medication List: 1)  Metformin Hcl 1000 Mg Tabs (Metformin hcl) .... Take 1 tablet by mouth once a day 2)  Lantus Solostar 100 Unit/ml Soln (Insulin glargine) .... Inject 15 units under the skin every night. 3)  Truetrack Test Strp (Glucose blood) .... Use to test blood sugar twice a day- before breakfst and dinner 4)  Lancets 30g Misc (Lancets) .... Use to test blood sugar twice a day 5)  Sure Comfort Pen Needles 31g X 5 Mm Misc (Insulin pen needle) .... Use to inject insulin once a day 6)  Atenolol 50 Mg Tabs (Atenolol) .... Take 1 tablet by mouth once a day 7)  Hydrochlorothiazide 50 Mg Tabs (Hydrochlorothiazide) .... Take 1 tablet by mouth once a day    Process Orders Check Orders Results:     [facility]: ABN not required for this insurance Tests Sent for requisitioning (Aug 06, 2009 3:48 PM):     08/06/2009: [facility] -- T-Comprehensive Metabolic Panel [[id number]] (signed)     08/06/2009: [facility] -- T-FSH [phone] (signed)     08/06/2009: [facility] -- T-LH [phone] (signed)     08/06/2009: [facility] -- T-TSH [phone] (signed)     08/06/2009: [facility] -- T-Testosterone; Total [phone] (signed)     08/06/2009: [facility] -- T-T4, [place] [[id number]] (signed)     08/06/2009: [facility] -- T- * Misc. Laboratory test [[id number]] (signed)      08/06/2009: [facility] -- T-C-Peptide [[id number]] (signed)     08/06/2009: [facility] -- T-Dehydroepiandrosterone-Sulfate [facility] S) [[id number]] (signed)     08/06/2009: [facility] -- T-Prolactin [phone] (signed)     08/06/2009: [facility] -- T-Cortisol Total [[id number]] (signed)     08/06/2009: [facility] -- [facility] w/Diff [[id number]] (signed)

## 2010-05-04 NOTE — Miscellaneous (Signed)
  Clinical Lists Changes  Observations: Added new observation of MRI: Solid mildly T2 hypointense bilateral ovarian lesions. Findings suspicious for bilateral fibroma, differential includes Brenner tumors. (11/12/2009 9:50) Added new observation of PELVIS US: Persistant 3 cm lesion in the R ovary suspicious for endometrioma with cystic ovarian neoplasm considered less likely. Endometrioma may be present in the left ovary but this is less visualized. , pelvic MRI recommended.  (10/14/2009 9:53)      MRI EXAM  Procedure date:  11/12/2009  Findings:      Solid mildly T2 hypointense bilateral ovarian lesions. Findings suspicious for bilateral fibroma, differential includes Brenner tumors.  Pelvic US  Procedure date:  10/14/2009  Findings:      Persistant 3 cm lesion in the R ovary suspicious for endometrioma with cystic ovarian neoplasm considered less likely. Endometrioma may be present in the left ovary but this is less visualized. , pelvic MRI recommended.    MRI EXAM  Procedure date:  11/12/2009  Findings:      Solid mildly T2 hypointense bilateral ovarian lesions. Findings suspicious for bilateral fibroma, differential includes Brenner tumors.  Pelvic US  Procedure date:  10/14/2009  Findings:      Persistant 3 cm lesion in the R ovary suspicious for endometrioma with cystic ovarian neoplasm considered less likely. Endometrioma may be present in the left ovary but this is less visualized. , pelvic MRI recommended.

## 2010-05-04 NOTE — Assessment & Plan Note (Signed)
Summary: FLU SHOT/CH  Nurse Visit   Allergies: 1)  ! Morphine 2)  ! Codeine  Orders Added: 1)  Admin 1st Vaccine [90471] 2)  Flu Vaccine 91yrs + [16109] Flu Vaccine Consent Questions     Do you have a history of severe allergic reactions to this vaccine? no    Any prior history of allergic reactions to egg and/or gelatin? no    Do you have a sensitivity to the preservative Thimersol? no    Do you have a past history of Guillan-Barre Syndrome? no    Do you currently have an acute febrile illness? no    Have you ever had a severe reaction to latex? no    Vaccine information given and explained to patient? yes    Are you currently pregnant? no    Lot Number:[id number]   Exp Date:10/02/2010   Manufacturer: [employer]    Site Given  Left Deltoid I.Kaye Goldston,CMA (AAMA)  January 15, 2010 11:55 AM M .Glorious Peach

## 2010-05-04 NOTE — Assessment & Plan Note (Signed)
Summary: ACUTE/MAGICK/PER Brenda Bowman WANTS PT BACK IN A WEEK/CH   Vital Signs:  Patient profile:   50 year old female Height:      64.75 inches (164.47 cm) Weight:      267.1 pounds (120.32 kg) BMI:     44.55 Temp:     97.3 degrees F (36.28 degrees C) oral Pulse rate:   68 / minute BP sitting:   152 / 84  (left arm) Cuff size:   large  Vitals Entered By: Theotis Barrio NT II (Aug 14, 2009 11:04 AM) CC: 1 WEEK FOLLOW UP / RESULTS OF U/S AND BLOOD WORK  / NEED CREAM FOR ? RASH UNDER BREAST Is Patient Diabetic? Yes Did you bring your meter with you today? Yes Pain Assessment Patient in pain? no      Nutritional Status BMI of > 30 = obese  Have you ever been in a relationship where you felt threatened, hurt or afraid?No   Does patient need assistance? Functional Status Self care Ambulation Normal Comments 1 WEEK FOLLOW UP  /  RESULTS OF U/S AND BL BLOOD WORK   / NEEED CREAM FOR ? RASH UNDER BREAST   Primary Care Provider:  Mliss Sax MD  CC:  1 WEEK FOLLOW UP / RESULTS OF U/S AND BLOOD WORK  / NEED CREAM FOR ? RASH UNDER BREAST.  History of Present Illness: 50 yo female with HTN and DM2 presents to the clinic for a follow up of her previous visit. Patient underwent US-abdomen and pevis for ovarian pathology and is here for a follow up on her all labs and [place]. She reports that she has been having periods for the last one year and she thinks that they have been regular.  She denies any other complaints.      Preventive Screening-Counseling & Management  Alcohol-Tobacco     Smoking Status: never  Caffeine-Diet-Exercise     Does Patient Exercise: yes  Problems Prior to Update: 1)  Hirsutism  (ICD-704.1) 2)  Hyperlipidemia  (ICD-272.4) 3)  Hypertension  (ICD-401.9) 4)  Diabetes Mellitus, Type II  (ICD-250.00) 5)  Dm  (ICD-250.00) 6)  Hypertension, Uncontrolled  (ICD-401.9) 7)  Pregnancy, High Risk  (ICD-V23.9)  Medications Prior to Update: 1)  Metformin Hcl 1000  Mg Tabs (Metformin Hcl) .... Take 1 Tablet By Mouth Once A Day 2)  Lantus Solostar 100 Unit/ml Soln (Insulin Glargine) .... Inject 15 Units Under The Skin Every Night. 3)  Truetrack Test  Strp (Glucose Blood) .... Use To Test Blood Sugar Twice A Day- Before Breakfst and Dinner 4)  Lancets 30g  Misc (Lancets) .... Use To Test Blood Sugar Twice A Day 5)  Sure Comfort Pen Needles 31g X 5 Mm Misc (Insulin Pen Needle) .... Use To Inject Insulin Once A Day 6)  Atenolol 50 Mg Tabs (Atenolol) .... Take 1 Tablet By Mouth Once A Day 7)  Hydrochlorothiazide 50 Mg Tabs (Hydrochlorothiazide) .... Take 1 Tablet By Mouth Once A Day  Current Medications (verified): 1)  Metformin Hcl 1000 Mg Tabs (Metformin Hcl) .... Take 1 Tablet By Mouth Once A Day 2)  Lantus Solostar 100 Unit/ml Soln (Insulin Glargine) .... Inject 15 Units Under The Skin Every Night. 3)  Truetrack Test  Strp (Glucose Blood) .... Use To Test Blood Sugar Twice A Day- Before Breakfst and Dinner 4)  Lancets 30g  Misc (Lancets) .... Use To Test Blood Sugar Twice A Day 5)  Sure Comfort Pen Needles 31g X 5 Mm Misc (Insulin  Pen Needle) .... Use To Inject Insulin Once A Day 6)  Atenolol 50 Mg Tabs (Atenolol) .... Take 1 Tablet By Mouth Once A Day 7)  Hydrochlorothiazide 50 Mg Tabs (Hydrochlorothiazide) .... Take 1 Tablet By Mouth Once A Day  Allergies (verified): 1)  ! Morphine 2)  ! Codeine  Past History:  Family History: Last updated: 07/30/2009 no known medical family history  Social History: Last updated: 07/30/2009 Married Alcohol use-no Drug use-no Regular exercise-yes  Risk Factors: Exercise: yes (08/14/2009)  Risk Factors: Smoking Status: never (08/14/2009)  Past Medical History: Reviewed history from 07/30/2009 and no changes required. Diabetes mellitus, type II Hypertension  Family History: Reviewed history from 07/30/2009 and no changes required. no known medical family history  Social History: Reviewed history  from 07/30/2009 and no changes required. Married Alcohol use-no Drug use-no Regular exercise-yes  Review of Systems      See HPI  Physical Exam  General:  Morbidly obese, NAD.Female pattern hair over the face. Frontal balding noted. Central obesity + Head:  normocephalic and atraumatic.   Neck:  thick neck, no palpable thyroids. Lungs:  Normal respiratory effort, chest expands symmetrically. Lungs are clear to auscultation, no crackles or wheezes. Heart:  Normal rate and regular rhythm. S1 and S2 normal without gallop, murmur, click, rub or other extra sounds. Abdomen:  Bowel sounds positive Pulses:  R radial normal.   Extremities:  No clubbing, cyanosis, edema, or deformity noted with normal full range of motion of all joints.   Neurologic:  alert & oriented X3 and cranial nerves II-XII intact.     Impression & Recommendations:  Problem # 1:  DIABETES MELLITUS, TYPE II (ICD-250.00) Fasting blood sugars consistently elevated at >150's. Will increase her Lantus to 20 units and will follow up in a month. Patient could not tolerate metformin 1000 two times a day.   Her updated medication list for this problem includes:    Metformin Hcl 1000 Mg Tabs (Metformin hcl) .Marland Kitchen... Take 1 tablet by mouth once a day    Lantus Solostar 100 Unit/ml Soln (Insulin glargine) ..... Inject 20 units under the skin every night.  Problem # 2:  HYPERTENSION (ICD-401.9) Increased her HCTZ last week. Patient is currently exercising and trying to lose weight and wants to hold on starting another BP medicine. ACE-I is not an option anyway, with her planning to conceive. Continue curent regimen.  Her updated medication list for this problem includes:    Atenolol 50 Mg Tabs (Atenolol) .Marland Kitchen... Take 1 tablet by mouth once a day    Hydrochlorothiazide 50 Mg Tabs (Hydrochlorothiazide) .Marland Kitchen... Take 1 tablet by mouth once a day  BP today: 152/84 Prior BP: 174/102 (08/06/2009)  Labs Reviewed: K+: 4.2  (08/06/2009) Creat: : 0.85 (08/06/2009)     Problem # 3:  HIRSUTISM (ICD-704.1)  All the labs, [place] reviewed. FSH, LH are at the lower limits of normal and her Free testosterone, total are within normal limits. US pelvis reveals a a 2.7 cm right sided ovarian hemorrhagic cystic lesion. Not sure if this could explain all her symptoms. Will try to make a closer Gyn appointment. Continue current management for now.  Future Orders: T-Ultrasound Abdominal, Complete ([address]) ... 10/08/2009  Complete Medication List: 1)  Metformin Hcl 1000 Mg Tabs (Metformin hcl) .... Take 1 tablet by mouth once a day 2)  Lantus Solostar 100 Unit/ml Soln (Insulin glargine) .... Inject 20 units under the skin every night. 3)  Truetrack Test Strp (Glucose blood) .... Use to  test blood sugar twice a day- before breakfst and dinner 4)  Lancets 30g Misc (Lancets) .... Use to test blood sugar twice a day 5)  Sure Comfort Pen Needles 31g X 5 Mm Misc (Insulin pen needle) .... Use to inject insulin once a day 6)  Atenolol 50 Mg Tabs (Atenolol) .... Take 1 tablet by mouth once a day 7)  Hydrochlorothiazide 50 Mg Tabs (Hydrochlorothiazide) .... Take 1 tablet by mouth once a day  Patient Instructions: 1)  Please schedule a follow-up appointment in 1 month. 2)  Take all the medications as advised below.   Prevention & Chronic Care Immunizations   Influenza vaccine: Not documented   Influenza vaccine deferral: Not indicated  (07/30/2009)    Tetanus booster: Not documented   Td booster deferral: Not indicated  (07/30/2009)    Pneumococcal vaccine: Not documented  Other Screening   Pap smear: Not documented   Pap smear action/deferral: Deferred-3 yr interval  (07/30/2009)    Mammogram: Not documented   Mammogram action/deferral: Deferred-2 yr interval  (07/30/2009)   Smoking status: never  (08/14/2009)  Diabetes Mellitus   HgbA1C: 9.8  (07/30/2009)    Eye exam: Not documented   Diabetic eye exam  action/deferral: Not indicated  (07/30/2009)    Foot exam: yes  (07/30/2009)   Foot exam action/deferral: Do today   High risk foot: No  (07/30/2009)   Foot care education: Done  (07/30/2009)    Urine microalbumin/creatinine ratio: Not documented  Lipids   Total Cholesterol: Not documented   LDL: Not documented   LDL Direct: Not documented   HDL: Not documented   Triglycerides: Not documented    SGOT (AST): 18  (08/06/2009)   SGPT (ALT): 21  (08/06/2009)   Alkaline phosphatase: 49  (08/06/2009)   Total bilirubin: 0.4  (08/06/2009)  Hypertension   Last Blood Pressure: 152 / 84  (08/14/2009)   Serum creatinine: 0.85  (08/06/2009)   BMP action: Not indicated   Serum potassium 4.2  (08/06/2009)  Self-Management Support :   Personal Goals (by the next clinic visit) :     Personal A1C goal: 7  (07/30/2009)     Personal blood pressure goal: 130/80  (07/30/2009)     Personal LDL goal: 100  (07/30/2009)    Patient will work on the following items until the next clinic visit to reach self-care goals:     Medications and monitoring: take my medicines every day, check my blood sugar, bring all of my medications to every visit, examine my feet every day  (08/14/2009)     Eating: drink diet soda or water instead of juice or soda, eat more vegetables, use fresh or frozen vegetables, eat foods that are low in salt, eat baked foods instead of fried foods, eat fruit for snacks and desserts, limit or avoid alcohol  (08/14/2009)     Activity: take a 30 minute walk every day  (08/14/2009)    Diabetes self-management support: Resources for patients handout  (08/14/2009)   Last diabetes self-management training by diabetes educator: 08/07/2009    Hypertension self-management support: Resources for patients handout  (08/14/2009)    Lipid self-management support: Resources for patients handout  (08/14/2009)        Resource handout printed.   Appended Document: ACUTE/MAGICK/PER Brenda Bowman WANTS PT  BACK IN A WEEK/CH   Impression & Recommendations:  Problem # 1:  HIRSUTISM (ICD-704.1) Will refer her to gynecology. Read my A/P from my office visit. Orders: Gynecologic Referral (Gyn)  Complete Medication List: 1)  Metformin Hcl 1000 Mg Tabs (Metformin hcl) .... Take 1 tablet by mouth once a day 2)  Lantus Solostar 100 Unit/ml Soln (Insulin glargine) .... Inject 20 units under the skin every night. 3)  Truetrack Test Strp (Glucose blood) .... Use to test blood sugar twice a day- before breakfst and dinner 4)  Lancets 30g Misc (Lancets) .... Use to test blood sugar twice a day 5)  Sure Comfort Pen Needles 31g X 5 Mm Misc (Insulin pen needle) .... Use to inject insulin once a day 6)  Atenolol 50 Mg Tabs (Atenolol) .... Take 1 tablet by mouth once a day 7)  Hydrochlorothiazide 50 Mg Tabs (Hydrochlorothiazide) .... Take 1 tablet by mouth once a day

## 2010-05-04 NOTE — Letter (Signed)
Summary: [facility]  [facility]   Imported By: Margie Billet 10/12/2009 09:52:01  _____________________________________________________________________  External Attachment:    Type:   Image     Comment:   External Document

## 2010-05-04 NOTE — Progress Notes (Signed)
Summary: REfill/gh  Phone Note Refill Request Message from:  Fax from Pharmacy on Sep 01, 2009 3:45 PM  Refills Requested: Medication #1:  HYDROCHLOROTHIAZIDE 50 MG TABS Take 1 tablet by mouth once a day.  Method Requested: Electronic Initial call taken by: Angelina Ok RN,  Sep 01, 2009 3:45 PM  Follow-up for Phone Call        completed refill, thank you Tatum Massman  Follow-up by: Mliss Sax MD,  September 02, 2009 9:04 PM    Prescriptions: HYDROCHLOROTHIAZIDE 50 MG TABS (HYDROCHLOROTHIAZIDE) Take 1 tablet by mouth once a day  #30 x 11   Entered and Authorized by:   Mliss Sax MD   Signed by:   Mliss Sax MD on 09/02/2009   Method used:   Faxed to ...       [facility] (retail)       [address] [place], [place]  [address]       Ph: [phone]       Fax: [phone]   RxID:   [id number]

## 2010-05-04 NOTE — Assessment & Plan Note (Signed)
Summary: med change/gg   see last note   Vital Signs:  Patient profile:   50 year old female Height:      64.75 inches (164.47 cm) Weight:      261.5 pounds (118.86 kg) BMI:     44.01 Temp:     98.9 degrees F Pulse rate:   66 / minute BP sitting:   140 / 98  (right arm) Cuff size:   large  Vitals Entered By: Dorie Rank RN (January 13, 2010 3:04 PM) CC: needs refills - Atenolol - is still taking it but last dr had changed her off med b/c she wanted to get pregnant- she did not stop though Is Patient Diabetic? Yes Did you bring your meter with you today? No Pain Assessment Patient in pain? no      Nutritional Status BMI of > 30 = obese CBG Result 112  Have you ever been in a relationship where you felt threatened, hurt or afraid?No   Does patient need assistance? Functional Status Self care Ambulation Normal Comments dry skin on both elbows since Sept.- palms have been itching last week or so   Primary Care Provider:  Mliss Sax MD  CC:  needs refills - Atenolol - is still taking it but last dr had changed her off med b/c she wanted to get pregnant- she did not stop though.  History of Present Illness: 50 y/o w with HTN, DM comes for atenolol refill. she also takes HCTZ  -her bp is controlled at home - no new complaints - trying to get pregnant since 5 months.  - has seen Ob-Gyn for cysts in ovary, Dr. Candice Camp thinks they can be cancerous and need a b/l oopherectomt while Dr. Roanna Epley ( Gyn onc) says cystectomy is ok per patinet history - Patinet has an appointment with Gyn on 10/21   Preventive Screening-Counseling & Management  Alcohol-Tobacco     Alcohol drinks/day: 0     Smoking Status: never  Caffeine-Diet-Exercise     Does Patient Exercise: yes     Type of exercise: Gym  Current Medications (verified): 1)  Metformin Hcl 1000 Mg Tabs (Metformin Hcl) .... Take 1 Tablet By Mouth Once A Day 2)  Lantus Solostar 100 Unit/ml Soln (Insulin Glargine) ....  Inject 20 Units Under The Skin Every Night. 3)  Truetrack Test  Strp (Glucose Blood) .... Use To Test Blood Sugar Twice A Day- Before Breakfst and Dinner 4)  Lancets 30g  Misc (Lancets) .... Use To Test Blood Sugar Twice A Day 5)  Sure Comfort Pen Needles 31g X 5 Mm Misc (Insulin Pen Needle) .... Use To Inject Insulin Once A Day 6)  Hydrochlorothiazide 50 Mg Tabs (Hydrochlorothiazide) .... Take 1 Tablet By Mouth Once A Day 7)  Cyclobenzaprine Hcl 10 Mg Tabs (Cyclobenzaprine Hcl) .... .take One Tablet By Mouth Q 12 Hours As Needed With Meals. 8)  Methyldopa 250 Mg Tabs (Methyldopa) .... Take 1 Tablet By Mouth Two Times A Day 9)  Folic Acid 1 Mg Tabs (Folic Acid) .... Take 1 Tablet By Mouth Once A Day  Allergies: 1)  ! Morphine 2)  ! Codeine  Review of Systems  The patient denies anorexia, fever, weight loss, weight gain, vision loss, decreased hearing, hoarseness, chest pain, syncope, dyspnea on exertion, peripheral edema, prolonged cough, headaches, hemoptysis, abdominal pain, melena, hematochezia, severe indigestion/heartburn, hematuria, incontinence, genital sores, muscle weakness, suspicious skin lesions, transient blindness, difficulty walking, depression, unusual weight change, abnormal bleeding, enlarged lymph nodes, angioedema,  breast masses, and testicular masses.    Physical Exam  General:  General: Alert, well developed and in no acurte distress ENT: mucous membranes pink & moist. No abnormal finds in ear and nose. CVC:S1 S2 , no murmurs, no abnormal heart sounds. Lungs: Clear to auscultation B/L. No wheezes, crackles or other abnormal sounds Abdomen: soft, non distended, no tender. Normal Bowel sounds EXT: no pitting edema, no engorged veins, Pulsations normal  Neuro:alert, oriented *3, cranial nerved 2-12 intact, strenght normal in all  extremities, senstations normal to light touch.     Impression & Recommendations:  Problem # 1:  HYPERTENSION (ICD-401.9) paitnet never  took methyldopa, continues to take HCTZ and atenolol  removing lisinopril from list as she is trying to get pregnant. Also, changing atenolol to methyldopa as she is actively trying for a baby follow up in 1 month ,also checke BMET   Her updated medication list for this problem includes:    Hydrochlorothiazide 50 Mg Tabs (Hydrochlorothiazide) .Marland Kitchen... Take 1 tablet by mouth once a day    Methyldopa 250 Mg Tabs (Methyldopa) .Marland Kitchen... Take 1 tablet by mouth two times a day  BP today: 154/98 Prior BP: 151/90 (11/24/2009)  Labs Reviewed: K+: 4.2 (08/06/2009) Creat: : 0.79 (10/29/2009)   Chol: 261 (11/24/2009)   HDL: 44 (11/24/2009)   LDL: 141 (11/24/2009)   TG: 379 (11/24/2009)  Problem # 2:  DIABETES MELLITUS, TYPE II (ICD-250.00) controlled   Her upated medication list for this problem includes:    Metformin Hcl 1000 Mg Tabs (Metformin hcl) .Marland Kitchen... Take 1 tablet by mouth once a day    Lantus Solostar 100 Unit/ml Soln (Insulin glargine) ..... Inject 20 units under the skin every night.  Orders: Capillary Blood Glucose/CBG [id number])  Labs Reviewed: Creat: 0.79 (10/29/2009)    Reviewed HgBA1c results: 6.8 (11/24/2009)  9.8 (07/30/2009)  Problem # 3:  HYPERLIPIDEMIA (ICD-272.4) not at gaol not on meds due to fear of teratogenecity    Labs Reviewed: SGOT: 18 (08/06/2009)   SGPT: 21 (08/06/2009)   HDL:44 (11/24/2009)  LDL:141 (11/24/2009)  Chol:261 (11/24/2009)  Trig:379 (11/24/2009)  Complete Medication List: 1)  Metformin Hcl 1000 Mg Tabs (Metformin hcl) .... Take 1 tablet by mouth once a day 2)  Lantus Solostar 100 Unit/ml Soln (Insulin glargine) .... Inject 20 units under the skin every night. 3)  Truetrack Test Strp (Glucose blood) .... Use to test blood sugar twice a day- before breakfst and dinner 4)  Lancets 30g Misc (Lancets) .... Use to test blood sugar twice a day 5)  Sure Comfort Pen Needles 31g X 5 Mm Misc (Insulin pen needle) .... Use to inject insulin once a day 6)   Hydrochlorothiazide 50 Mg Tabs (Hydrochlorothiazide) .... Take 1 tablet by mouth once a day 7)  Cyclobenzaprine Hcl 10 Mg Tabs (Cyclobenzaprine hcl) .... .take one tablet by mouth q 12 hours as needed with meals. 8)  Methyldopa 250 Mg Tabs (Methyldopa) .... Take 1 tablet by mouth two times a day 9)  Folic Acid 1 Mg Tabs (Folic acid) .... Take 1 tablet by mouth once a day  Patient Instructions: 1)  Please schedule a follow-up appointment in 1 month. Prescriptions: METHYLDOPA 250 MG TABS (METHYLDOPA) Take 1 tablet by mouth two times a day  #60 x 11   Entered and Authorized by:   Bethel Born MD   Signed by:   Bethel Born MD on 01/13/2010   Method used:   Electronically to  [facility] [address] [phone]* (retail)       [address]       [place], [place]  [address]       Ph: [phone]       Fax: [phone]   RxID:   [id number] HYDROCHLOROTHIAZIDE 50 MG TABS (HYDROCHLOROTHIAZIDE) Take 1 tablet by mouth once a day  #30 x 11   Entered and Authorized by:   Bethel Born MD   Signed by:   Bethel Born MD on 01/13/2010   Method used:   Electronically to        [facility] [phone]* (retail)       [address]       [place], [place]  [address]       Ph: [phone]       Fax: [phone]   RxID:   [id number] METHYLDOPA 250 MG TABS (METHYLDOPA) Take 1 tablet by mouth two times a day  #60 x 11   Entered and Authorized by:   Bethel Born MD   Signed by:   Bethel Born MD on 01/13/2010   Method used:   Faxed to ...       [facility] (retail)       [address] [place], [place]  [address]       Ph: [phone]       Fax: [phone]   RxID:   [id number] HYDROCHLOROTHIAZIDE 50 MG TABS (HYDROCHLOROTHIAZIDE) Take 1 tablet by mouth once a day  #30 x 11   Entered and Authorized by:   Bethel Born MD   Signed by:   Bethel Born MD on 01/13/2010   Method used:   Faxed to ...       [facility] (retail)        [address] [place], [place]  [address]       Ph: [phone]       Fax: [phone]   RxID:   [id number]    Prevention & Chronic Care Immunizations   Influenza vaccine: Not documented   Influenza vaccine deferral: Not indicated  (07/30/2009)   Influenza vaccine due: 12/03/2009    Tetanus booster: Not documented   Td booster deferral: Not indicated  (07/30/2009)    Pneumococcal vaccine: Not documented  Other Screening   Pap smear: Not documented   Pap smear action/deferral: Deferred-3 yr interval  (07/30/2009)    Mammogram: Not documented   Mammogram action/deferral: Deferred-2 yr interval  (07/30/2009)   Smoking status: never  (01/13/2010)  Diabetes Mellitus   HgbA1C: 6.8  (11/24/2009)   Hemoglobin A1C due: 05/27/2010    Eye exam: Not documented   Diabetic eye exam action/deferral: Ophthalmology referral  (11/24/2009)    Foot exam: yes  (07/30/2009)   Foot exam action/deferral: Do today   High risk foot: No  (07/30/2009)   Foot care education: Done  (07/30/2009)   Foot exam due: 11/25/2010    Urine microalbumin/creatinine ratio: 5.6  (11/24/2009)   Urine microalbumin action/deferral: Ordered   Urine microalbumin/cr due: 11/25/2010  Lipids   Total Cholesterol: 261  (11/24/2009)   Lipid panel action/deferral: Lipid Panel ordered   LDL: 141  (11/24/2009)   LDL Direct: Not documented   HDL: 44  (11/24/2009)   Triglycerides: 379  (11/24/2009)   Lipid panel due: 11/25/2010    SGOT (AST): 18  (08/06/2009)   SGPT (ALT): 21  (08/06/2009)   Alkaline  phosphatase: 49  (08/06/2009)   Total bilirubin: 0.4  (08/06/2009)   Liver panel due: 09/06/2009  Hypertension   Last Blood Pressure: 140 / 98  (01/13/2010)   Serum creatinine: 0.79  (10/29/2009)   BMP action: Not indicated   Serum potassium 4.2  (08/06/2009)  Self-Management Support :   Personal Goals (by the next clinic visit) :     Personal A1C goal: 7  (07/30/2009)     Personal blood pressure  goal: 130/80  (07/30/2009)     Personal LDL goal: 100  (07/30/2009)    Patient will work on the following items until the next clinic visit to reach self-care goals:     Medications and monitoring: take my medicines every day, check my blood sugar, bring all of my medications to every visit, examine my feet every day  (01/13/2010)     Eating: drink diet soda or water instead of juice or soda, eat more vegetables, eat foods that are low in salt, eat baked foods instead of fried foods, eat fruit for snacks and desserts, limit or avoid alcohol  (01/13/2010)     Activity: join a walking program  (01/13/2010)     Other: joined gym  (01/13/2010)    Diabetes self-management support: Pre-printed educational material, Written self-care plan, Resources for patients handout  (01/13/2010)   Diabetes care plan printed   Last diabetes self-management training by diabetes educator: 08/07/2009    Hypertension self-management support: Written self-care plan, Resources for patients handout  (01/13/2010)   Hypertension self-care plan printed.    Lipid self-management support: Written self-care plan, Resources for patients handout  (01/13/2010)   Lipid self-care plan printed.      Resource handout printed.

## 2010-05-04 NOTE — Letter (Signed)
Summary: [facility] REPORT  [facility] REPORT   Imported By: Shon Hough 12/08/2009 15:07:22  _____________________________________________________________________  External Attachment:    Type:   Image     Comment:   External Document

## 2010-05-04 NOTE — Progress Notes (Signed)
Summary: lab requests/gp  Phone Note Call from Patient   Caller: Patient Summary of Call: Pt. called requesting lab results; I will send this request to  Dr. Denton Meek. Tlelphone # [phone]. Initial call taken by: Chinita Pester RN,  November 25, 2009 12:20 PM  Follow-up for Phone Call        Phone Call Completed, Provider Notified Follow-up by: Deatra Robinson MD,  November 25, 2009 1:39 PM

## 2010-05-04 NOTE — Assessment & Plan Note (Signed)
Summary: eye drainage/gg   Vital Signs:  Patient profile:   50 year old female Height:      64.75 inches (164.47 cm) Weight:      263.8 pounds (119.91 kg) BMI:     44.40 Temp:     98.7 degrees F oral Pulse rate:   65 / minute BP sitting:   151 / 90  (right arm)  Vitals Entered By: Chinita Pester RN (November 24, 2009 3:48 PM) CC: Drainage from both eyes for about 2 weeks.Also  "shadow"over left pain. Also unable to tolerate food since returned from [place]. Is Patient Diabetic? Yes Did you bring your meter with you today? Yes Pain Assessment Patient in pain? no      Nutritional Status BMI of > 30 = obese CBG Result 123  Have you ever been in a relationship where you felt threatened, hurt or afraid?Unable to ask;someonew/pt.   Does patient need assistance? Functional Status Self care Ambulation Normal   Primary Care Provider:  Mliss Sax MD  CC:  Drainage from both eyes for about 2 weeks.Also  "shadow"over left pain. Also unable to tolerate food since returned from [place].Marland Kitchen  History of Present Illness: 1.C/o watery and itchy eyes bilaterally for 1 week after her trip to [place](?). Denies any ocular pain, fever, chills, rash or trauma; no known exposure to any allergens. never experienced similar Sx in the past. States that her vision is gradually getting worse but it has been noted months prior to current opthalmic symptoms. 2. HTN -reports taking all her meds as prescribed. Denies any HA, weakness or sensory deficits. 3. Hx of ovarian lesions bilaterally. Had an MRI of pelvis on August 4th, 2011.   Depression History:      The patient denies a depressed mood most of the day and a diminished interest in her usual daily activities.         Preventive Screening-Counseling & Management  Alcohol-Tobacco     Alcohol drinks/day: 0     Smoking Status: never  Caffeine-Diet-Exercise     Does Patient Exercise: yes     Type of exercise: Gym  Problems Prior to Update: 1)   Metabolic Syndrome X  (ICD-277.7) 2)  Conjunctivitis, Acute, Bilateral  (ICD-372.00) 3)  Ovarian Mass  (ICD-625.8) 4)  Hirsutism  (ICD-704.1) 5)  Hyperlipidemia  (ICD-272.4) 6)  Hypertension  (ICD-401.9) 7)  Diabetes Mellitus, Type II  (ICD-250.00) 8)  Dm  (ICD-250.00) 9)  Hypertension, Uncontrolled  (ICD-401.9) 10)  Pregnancy, High Risk  (ICD-V23.9)  Current Problems (verified): 1)  Hirsutism  (ICD-704.1) 2)  Hyperlipidemia  (ICD-272.4) 3)  Hypertension  (ICD-401.9) 4)  Diabetes Mellitus, Type II  (ICD-250.00) 5)  Dm  (ICD-250.00) 6)  Hypertension, Uncontrolled  (ICD-401.9) 7)  Pregnancy, High Risk  (ICD-V23.9)  Medications Prior to Update: 1)  Metformin Hcl 1000 Mg Tabs (Metformin Hcl) .... Take 1 Tablet By Mouth Once A Day 2)  Lantus Solostar 100 Unit/ml Soln (Insulin Glargine) .... Inject 20 Units Under The Skin Every Night. 3)  Truetrack Test  Strp (Glucose Blood) .... Use To Test Blood Sugar Twice A Day- Before Breakfst and Dinner 4)  Lancets 30g  Misc (Lancets) .... Use To Test Blood Sugar Twice A Day 5)  Sure Comfort Pen Needles 31g X 5 Mm Misc (Insulin Pen Needle) .... Use To Inject Insulin Once A Day 6)  Atenolol 50 Mg Tabs (Atenolol) .... Take 1 Tablet By Mouth Once A Day 7)  Hydrochlorothiazide 50 Mg Tabs (Hydrochlorothiazide) .Marland KitchenMarland KitchenMarland Kitchen  Take 1 Tablet By Mouth Once A Day  Current Medications (verified): 1)  Metformin Hcl 1000 Mg Tabs (Metformin Hcl) .... Take 1 Tablet By Mouth Once A Day 2)  Lantus Solostar 100 Unit/ml Soln (Insulin Glargine) .... Inject 20 Units Under The Skin Every Night. 3)  Truetrack Test  Strp (Glucose Blood) .... Use To Test Blood Sugar Twice A Day- Before Breakfst and Dinner 4)  Lancets 30g  Misc (Lancets) .... Use To Test Blood Sugar Twice A Day 5)  Sure Comfort Pen Needles 31g X 5 Mm Misc (Insulin Pen Needle) .... Use To Inject Insulin Once A Day 6)  Atenolol 50 Mg Tabs (Atenolol) .... Take 1 Tablet By Mouth Once A Day 7)  Hydrochlorothiazide 50 Mg  Tabs (Hydrochlorothiazide) .... Take 1 Tablet By Mouth Once A Day 8)  Nabumetone 500 Mg Tabs (Nabumetone) .... Take One Tablet By Mouth Q 12 Hours As Needed With Meals For Pain 9)  Cyclobenzaprine Hcl 10 Mg Tabs (Cyclobenzaprine Hcl) .... .take One Tablet By Mouth Q 12 Hours As Needed With Meals.  Allergies (verified): 1)  ! Morphine 2)  ! Codeine  Directives (verified): 1)  Full Code   Past History:  Past Medical History: Last updated: 07/30/2009 Diabetes mellitus, type II Hypertension  Family History: Last updated: 07/30/2009 no known medical family history  Social History: Last updated: 07/30/2009 Married Alcohol use-no Drug use-no Regular exercise-yes  Risk Factors: Alcohol Use: 0 (11/24/2009) Exercise: yes (11/24/2009)  Risk Factors: Smoking Status: never (11/24/2009)  Review of Systems       per HPI  Physical Exam  General:  alert, well-developed, well-nourished, well-hydrated, and overweight-appearing.   Head:  Normocephalic and atraumatic without obvious abnormalities. No apparent alopecia or balding. Eyes:  erythematous conjunctiva bilatearlly with small amount of clear discharge; no ulcers or lesions noted; PERRL bilaterally; red reflex present bilaterally; no hemorrhages with fundoscopic exam. Ears:  External ear exam shows no significant lesions or deformities.  Otoscopic examination reveals clear canals, tympanic membranes are intact bilaterally without bulging, retraction, inflammation or discharge. Hearing is grossly normal bilaterally. Nose:  Boggy nasal mucosa; no discharge. Mouth:  Oral mucosa and oropharynx without lesions or exudates.  Teeth in good repair. Neck:  No deformities, masses, or tenderness noted. Lungs:  Normal respiratory effort, chest expands symmetrically. Lungs are clear to auscultation, no crackles or wheezes. Heart:  Normal rate and regular rhythm. S1 and S2 normal without gallop, murmur, click, rub or other extra  sounds. Abdomen:  Bowel sounds positive,abdomen soft and non-tender without masses, organomegaly or hernias noted. Msk:  No deformity or scoliosis noted of thoracic or lumbar spine.   Pulses:  R and L carotid,radial,femoral,dorsalis pedis and posterior tibial pulses are full and equal bilaterally Extremities:  No clubbing, cyanosis, edema, or deformity noted with normal full range of motion of all joints.   Neurologic:  alert & oriented X3.  alert & oriented X3.   Skin:  Intact without suspicious lesions or rashes Cervical Nodes:  No lymphadenopathy noted Psych:  Cognition and judgment appear intact. Alert and cooperative with normal attention span and concentration. No apparent delusions, illusions, hallucinations   Impression & Recommendations:  Problem # 1:  HYPERTENSION (ICD-401.9) Still suboptimal control. Will add Lisinopril given patient is a diabetic patient. Weight managment, low salt diet discussed. The following medications were removed from the medication list:    Atenolol 50 Mg Tabs (Atenolol) .Marland Kitchen... Take 1 tablet by mouth once a day    Lisinopril 10 Mg  Tabs (Lisinopril) .Marland Kitchen... Take one tablet by mouth daily Her updated medication list for this problem includes:    Hydrochlorothiazide 50 Mg Tabs (Hydrochlorothiazide) .Marland Kitchen... Take 1 tablet by mouth once a day    Methyldopa 250 Mg Tabs (Methyldopa) .Marland Kitchen... Take 1 tablet by mouth two times a day  BP today: 151/98 Prior BP: 152/84 (08/14/2009)  Labs Reviewed: K+: 4.2 (08/06/2009) Creat: : 0.79 (10/29/2009)     Problem # 2:  DIABETES MELLITUS, TYPE II (ICD-250.00) Again, weight management, diet, exercise discussed. Foot care and eye care discussed. The following medications were removed from the medication list:    Lisinopril 10 Mg Tabs (Lisinopril) .Marland Kitchen... Take one tablet by mouth daily Her updated medication list for this problem includes:    Metformin Hcl 1000 Mg Tabs (Metformin hcl) .Marland Kitchen... Take 1 tablet by mouth once a day     Lantus Solostar 100 Unit/ml Soln (Insulin glargine) ..... Inject 20 units under the skin every night.  Orders: T- Capillary Blood Glucose ([id number]) T-Hgb A1C (in-house) ([id number]) Ophthalmology Referral (Ophthalmology)  Labs Reviewed: Creat: 0.79 (10/29/2009)    Reviewed HgBA1c results: 6.8 (11/24/2009)  9.8 (07/30/2009)  Problem # 3:  OVARIAN MASS (ICD-625.8)  Patient has a follow up with OB/GYN at the [facility] of [place]. Strongly advised to follow up as was instructed.  Orders: T-Urine Pregnancy (in -house) [phone])  Problem # 4:  CONJUNCTIVITIS, ACUTE, BILATERAL (ICD-372.00) Maxitrol ophtnalmic drops; contact precautions given. Discussed treatment, and urged patient to wash hands carefully after touching face.   Complete Medication List: 1)  Metformin Hcl 1000 Mg Tabs (Metformin hcl) .... Take 1 tablet by mouth once a day 2)  Lantus Solostar 100 Unit/ml Soln (Insulin glargine) .... Inject 20 units under the skin every night. 3)  Truetrack Test Strp (Glucose blood) .... Use to test blood sugar twice a day- before breakfst and dinner 4)  Lancets 30g Misc (Lancets) .... Use to test blood sugar twice a day 5)  Sure Comfort Pen Needles 31g X 5 Mm Misc (Insulin pen needle) .... Use to inject insulin once a day 6)  Hydrochlorothiazide 50 Mg Tabs (Hydrochlorothiazide) .... Take 1 tablet by mouth once a day 7)  Cyclobenzaprine Hcl 10 Mg Tabs (Cyclobenzaprine hcl) .... .take one tablet by mouth q 12 hours as needed with meals. 8)  Poly-dex 3.5-10000-0.1 Susp (Neomycin-polymyxin-dexameth) .... Apply one drop to each eye q 8 hrs for 5-7days 9)  Methyldopa 250 Mg Tabs (Methyldopa) .... Take 1 tablet by mouth two times a day 10)  Folic Acid 1 Mg Tabs (Folic acid) .... Take 1 tablet by mouth once a day  Other Orders: T-Urine Microalbumin w/creat. ratio [phone]) T-Lipid Profile ([id number])  Patient Instructions: 1)  Please, pick up your prescriptions at the  pharmacy. 2)  New medication for  blood pressure:Methyldopa. Stop taking Atenolol and Nabumetone. 3)  New medication for itchy eyes: Maxitrol. 4)  Avoid touching or rubbing your eyes. Wash your hands regularly. 5)  Please, follow up with your gynecologist as were instructed. 6)  You are being placed on a waiting list in order to see an eye specialist. 7)  Call with any questions. 8)  Otherwise, follow up in 1 month or sooner with Dr. Aldine Contes. Prescriptions: FOLIC ACID 1 MG TABS (FOLIC ACID) Take 1 tablet by mouth once a day  #30 x 11   Entered and Authorized by:   Deatra Robinson MD   Signed by:   Deatra Robinson MD on 11/24/2009   Method  used:   Faxed to ...       [facility] Department (retail)       [address] [place], [place]  [address]       Ph: [phone]       Fax: [phone]   RxID:   [id number] METHYLDOPA 250 MG TABS (METHYLDOPA) Take 1 tablet by mouth two times a day  #60 x 11   Entered and Authorized by:   Deatra Robinson MD   Signed by:   Deatra Robinson MD on 11/24/2009   Method used:   Faxed to ...       [facility] Department (retail)       [address] [place], [place]  [address]       Ph: [phone]       Fax: [phone]   RxID:   [id number] POLY-DEX 3.5-10000-0.1 SUSP [facility]) apply one drop to each eye q 8 hrs for 5-7days  #1 x 0   Entered and Authorized by:   Deatra Robinson MD   Signed by:   Deatra Robinson MD on 11/24/2009   Method used:   Faxed to ...       [facility] Department (retail)       [address] [place], [place]  [address]       Ph: [phone]       Fax: [phone]   RxID:   [id number] LISINOPRIL 10 MG TABS (LISINOPRIL) Take one tablet by mouth daily  #30 x 6   Entered and Authorized by:   Deatra Robinson MD   Signed by:   Deatra Robinson MD on 11/24/2009   Method used:   Faxed to ...       [facility] Department (retail)        [address] [place], [place]  [address]       Ph: [phone]       Fax: [phone]   RxID:   [id number]   Handout requested. CYCLOBENZAPRINE HCL 10 MG TABS (CYCLOBENZAPRINE HCL) .take one tablet by mouth q 12 hours as needed with meals.  #60 x 3   Entered and Authorized by:   Deatra Robinson MD   Signed by:   Deatra Robinson MD on 11/24/2009   Method used:   Faxed to ...       [facility] Department (retail)       [address] [place], [place]  [address]       Ph: [phone]       Fax: [phone]   RxID:   [id number]   Handout requested. NABUMETONE 500 MG TABS (NABUMETONE) Take one tablet by mouth q 12 hours as needed with meals for pain  #60 x 6   Entered and Authorized by:   Deatra Robinson MD   Signed by:   Deatra Robinson MD on 11/24/2009   Method used:   Faxed to ...       [facility] Department (retail)       [address] [place], [place]  [address]       Ph: [phone]       Fax: [phone]   RxID:   [id number]   Prevention & Chronic Care Immunizations   Influenza vaccine: Not documented  Influenza vaccine deferral: Not indicated  (07/30/2009)   Influenza vaccine due: 12/03/2009    Tetanus booster: Not documented   Td booster deferral: Not indicated  (07/30/2009)    Pneumococcal vaccine: Not documented  Other Screening   Pap smear: Not documented   Pap smear action/deferral: Deferred-3 yr interval  (07/30/2009)    Mammogram: Not documented   Mammogram action/deferral: Deferred-2 yr interval  (07/30/2009)   Smoking status: never  (11/24/2009)  Diabetes Mellitus   HgbA1C: 6.8  (11/24/2009)   Hemoglobin A1C due: 05/27/2010    Eye exam: Not documented   Diabetic eye exam action/deferral: Ophthalmology referral  (11/24/2009)    Foot exam: yes  (07/30/2009)   Foot exam action/deferral: Do today   High risk foot: No  (07/30/2009)   Foot care education: Done  (07/30/2009)   Foot exam  due: 11/25/2010    Urine microalbumin/creatinine ratio: Not documented   Urine microalbumin action/deferral: Ordered   Urine microalbumin/cr due: 11/25/2010    Diabetes flowsheet reviewed?: Yes   Progress toward A1C goal: Improved  Lipids   Total Cholesterol: Not documented   Lipid panel action/deferral: Lipid Panel ordered   LDL: Not documented   LDL Direct: Not documented   HDL: Not documented   Triglycerides: Not documented   Lipid panel due: 11/25/2010    SGOT (AST): 18  (08/06/2009)   SGPT (ALT): 21  (08/06/2009)   Alkaline phosphatase: 49  (08/06/2009)   Total bilirubin: 0.4  (08/06/2009)   Liver panel due: 09/06/2009    Lipid flowsheet reviewed?: Yes   Progress toward LDL goal: Unchanged  Hypertension   Last Blood Pressure: 151 / 90  (11/24/2009)   Serum creatinine: 0.79  (10/29/2009)   BMP action: Not indicated   Serum potassium 4.2  (08/06/2009)    Hypertension flowsheet reviewed?: Yes   Progress toward BP goal: Deteriorated    Stage of readiness to change (hypertension management): Action  Self-Management Support :   Personal Goals (by the next clinic visit) :     Personal A1C goal: 7  (07/30/2009)     Personal blood pressure goal: 130/80  (07/30/2009)     Personal LDL goal: 100  (07/30/2009)    Patient will work on the following items until the next clinic visit to reach self-care goals:     Medications and monitoring: take my medicines every day, examine my feet every day  (11/24/2009)     Eating: drink diet soda or water instead of juice or soda, eat more vegetables, use fresh or frozen vegetables, eat foods that are low in salt, eat baked foods instead of fried foods, eat fruit for snacks and desserts  (11/24/2009)     Activity: park at the far end of the parking lot  (11/24/2009)    Diabetes self-management support: Copy of home glucose meter record, Written self-care plan  (11/24/2009)   Diabetes care plan printed   Last diabetes self-management  training by diabetes educator: 08/07/2009    Hypertension self-management support: Written self-care plan  (11/24/2009)   Hypertension self-care plan printed.    Lipid self-management support: Written self-care plan  (11/24/2009)   Lipid self-care plan printed.   Nursing Instructions: Refer for screening diabetic eye exam (see order)   Process Orders Check Orders Results:     [facility]: ABN not required for this insurance Tests Sent for requisitioning (November 25, 2009 8:56 AM):     11/24/2009: [facility] -- T-Urine Microalbumin w/creat. ratio [[id number]] (signed)  11/24/2009: [facility] -- T-Lipid Profile [id number] (signed)     Laboratory Results   Urine Tests      Urine HCG: negative  Blood Tests   Date/Time Received: November 24, 2009 4:10 PM Date/Time Reported: Alric Quan  November 24, 2009 4:10 PM    HGBA1C: 6.8%   (Normal Range: Non-Diabetic - 3-6%   Control Diabetic - 6-8%) CBG Random:: 123mg /dL

## 2010-05-04 NOTE — Letter (Signed)
Summary: [facility] HOSPITAL CLINICS/MEDICAL RECORDS  [facility] CLINICS/MEDICAL RECORDS   Imported By: Margie Billet 01/18/2010 12:13:58  _____________________________________________________________________  External Attachment:    Type:   Image     Comment:   External Document

## 2010-05-04 NOTE — Miscellaneous (Signed)
  Clinical Lists Changes  Observations: Added new observation of TESTO, TOTAL: 10 ng/dL (16/01/9603 5:40) Added new observation of PROLACTIN: 6.3 ng/mL (01/22/2010 9:54) Added new observation of FSH: 2.4 milliintl units/mL (01/22/2010 9:54) Added new observation of C-PEPTIDE: 3.12 ng/mL (01/22/2010 9:54) Added new observation of CORTISOL SER: 6.2 mcg/dL (98/02/9146 8:29) Added new observation of WET MT CLUE: negative  (12/16/2009 9:54) Added new observation of WBC WET PREP: negative  (12/16/2009 9:54) Added new observation of TESTO, TOTAL: 38.50 ng/dL (56/21/3086 5:78) Added new observation of PROGESTERONE: 10.4 ng/mL (12/16/2009 9:54) Added new observation of GC DNA PROBE: negative  (12/16/2009 9:54) Added new observation of FSH: 8.6 milliintl units/mL (12/16/2009 9:54) Added new observation of ESTRADIOL: 18.9 pg/mL (12/16/2009 9:54) Added new observation of CHLAMYD DNA: negative  (12/16/2009 9:54) Added new observation of CA-125: 7.4 units/mL (12/16/2009 9:54) Added new observation of T4, FREE: 1.29 ng/dL (46/96/2952 8:41) Added new observation of DHEA-S: 113 mcg/dL (32/44/0102 7:25) Added new observation of PLATELETK/UL: 315 K/uL (09/11/2009 9:54) Added new observation of RDW: 14.1 % (09/11/2009 9:54) Added new observation of MCHC RBC: 34.1 g/dL (36/64/4034 7:42) Added new observation of MCV: 86.2 fL (09/11/2009 9:54) Added new observation of HCT: 41.1 % (09/11/2009 9:54) Added new observation of HGB: 14 g/dL (59/56/3875 6:43) Added new observation of RBC M/UL: 4.77 M/uL (09/11/2009 9:54) Added new observation of WBC COUNT: 9.6 10*3/microliter (09/11/2009 9:54) Added new observation of CALCIUM: 10.0 mg/dL (32/95/1884 1:66) Added new observation of ALBUMIN: 4.5 g/dL (10/02/1599 0:93) Added new observation of PROTEIN, TOT: 7.4 g/dL (23/55/7322 0:25) Added new observation of SGPT (ALT): 21 units/L (09/11/2009 9:54) Added new observation of SGOT (AST): 18 units/L (09/11/2009  9:54) Added new observation of ALK PHOS: 49 units/L (09/11/2009 9:54) Added new observation of BILI TOTAL: 0.4 mg/dL (42/70/6237 6:28) Added new observation of CREATININE: 0.85 mg/dL (31/51/7616 0:73) Added new observation of BUN: 12 mg/dL (71/09/2692 8:54) Added new observation of BG RANDOM: 143 mg/dL (62/70/3500 9:38) Added new observation of CO2 PLSM/SER: 25 meq/L (09/11/2009 9:54) Added new observation of CL SERUM: 100 meq/L (09/11/2009 9:54) Added new observation of K SERUM: 4.2 meq/L (09/11/2009 9:54) Added new observation of NA: 138 meq/L (09/11/2009 9:54)

## 2010-05-04 NOTE — Progress Notes (Signed)
  Phone Note Outgoing Call   Call placed by: Theotis Barrio NT II,  February 11, 2010 11:54 AM Call placed to: Patient Details for Reason: [facility] Summary of Call: CALLED THE PATIENT TO GET HER SCHEDULED FOR [facility], NO ANSWER.

## 2010-05-04 NOTE — Miscellaneous (Signed)
Summary: [facility]  [facility]   Imported By: Margie Billet 10/12/2009 14:43:06  _____________________________________________________________________  External Attachment:    Type:   Image     Comment:   External Document

## 2010-05-04 NOTE — Progress Notes (Signed)
Summary: phone/gg  Phone Note Call from Patient   Caller: Patient Summary of Call: Pt called stating she was having some pain in her upper jaw and she wanted to be sure it was not lock-jaw from using the pen needles for her  insulin injection. I told pt to be sure and clean her skin with alcohol before injections.  She changes her needles frequently. She has appointment next week, she felt better knowing this is not lock-jaw.  She will call if other concerns. Initial call taken by: Merrie Roof RN,  January 28, 2010 12:03 PM  Follow-up for Phone Call        I agree with plan I will see her next week Follow-up by: Mliss Sax MD,  January 28, 2010 12:13 PM

## 2010-05-04 NOTE — Assessment & Plan Note (Signed)
Summary: DM TEACHING/CH   Allergies: 1)  ! Morphine 2)  ! Codeine   Complete Medication List: 1)  Metformin Hcl 1000 Mg Tabs (Metformin hcl) .... Take 1 tablet by mouth two times a day 2)  Lopressor Hct 50-25 Mg Tabs (Metoprolol-hydrochlorothiazide) .... Take 1 tablet by mouth once a day 3)  Lantus Solostar 100 Unit/ml Soln (Insulin glargine) .... Inject 10 units under the skin every night and increase 1 unit each night until fastings blood sugar < 130 mg/dl 4)  Truetrack Test Strp (Glucose blood) .... Use to test blood sugar twice a day- before breakfst and dinner 5)  Lancets 30g Misc (Lancets) .... Use to test blood sugar twice a day 6)  Sure Comfort Pen Needles 31g X 5 Mm Misc (Insulin pen needle) .... 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 patient able to return demonstration- CBG 199 3 hours after breakast. Patient also gave self injection of saline wiht syringe and returnd demonstration of how to use insulin pen. also instructed on blood sugar targets both pre nad post pregnancy, how to record CBgs and how to sel ftitrate lantus dose based on fasign CBgs. set taget fastings at 130 for this week- suggest < 110 for prepregnancy  Long Acting  Insulin Type:Lantus  Bedtime Dose: 10    Monitoring Self monitoring blood glucose 2 times a day Name of Meter  True Track  Time of Testing  Before Breakfast  Before Dinner  Recent Episodes of: Requiring Help from another person  Hyperglycemia :  Yes     Diabetes Disease Process  Discussed today Define diabetes in simple terms: No knowledgeState own type of diabetes: Demonstrates competency   State diabetes is treated by meal plan-exercise-medication-monitoring-education: Needs review/assistance Medications State name-action-dose-duration-side effects-and time to take medication: No knowledge   State appropriate timing of food related to medication: No knowledge   Demonstrates/verbalizes site selection and rotation for injections No knowledge   State insulin adjustment guidelines: No knowledge   Describe safe needle/lancet disposal: No knowledge    Nutritional Management  Monitoring State purpose and frequency of monitoring BG-ketones-HgbA1C  : No knowledge   Perform glucose monitoring/ketone testing and record results correctly: No knowledge    State target blood glucose and HgbA1C goals: No knowledge    Complications State the causes-signs and symptoms and prevention of Hyperglycemia: No knowledgeExplain proper treatment of hyperglycemia: No knowledgeState the causes- signs and symptoms and prevention of hypoglycemia: No knowledgeExplain proper treatment of hypoglycemia: No knowledge Exercise  Lifestyle changes:Goal setting and Problem solving State benefits of making appropriate lifestyle changes: No knowledgeIdentify lifestyle behaviors that need to change: No knowledgeIdentify risk factors that interfere with health: No knowledgeDevelop strategies to reduce risk factors: No knowledgeState relationship between blood sugar control and pregnancy outcome: No knowledgeExplain risk of maternal and fetal complications due to diabetes: No knowledgeDiabetes Management Education Done: 07/30/2009    BEHAVIORAL GOALS INITIAL Utilizing medications if for therapeutic effectiveness: inject 10 units alntus tonight and for three nights, then increase 1 unit each night until fastings < 130 Monitoring blood glucose levels daily: check  CBgs every morning        Diabetes Self Management Support: will assess at future visit Follow-up:next week and then eveyr few weeks until comfortable with her diabetes self care  patient to work toward becoming qualified for [employer]

## 2010-05-06 ENCOUNTER — Ambulatory Visit: Payer: Self-pay | Admitting: Internal Medicine

## 2010-05-06 ENCOUNTER — Ambulatory Visit: Admit: 2010-05-06 | Payer: Self-pay

## 2010-05-06 NOTE — Progress Notes (Signed)
  Phone Note Outgoing Call   Call placed by: Theotis Barrio NT II,  April 29, 2010 4:08 PM Call placed to: Patient Details for Reason: [facility] Summary of Call: CALLED AND LEFT VOICE MESSAGE FOR PATIENT TO CALL ME BACK TO GET THIS SET UP

## 2010-05-06 NOTE — Progress Notes (Signed)
Summary: refill/ha  Phone Note Refill Request Message from:  Patient on April 07, 2010 9:07 AM  Refills Requested: Medication #1:  METFORMIN HCL 1000 MG TABS Take 1 tablet by mouth once a day   Dosage confirmed as above?Dosage Confirmed   Last Refilled: 10/18 90 day supply please  Initial call taken by: Marin Roberts RN,  April 07, 2010 9:07 AM  Follow-up for Phone Call        completed refill, thank you Deajah Erkkila  Follow-up by: Mliss Sax MD,  April 09, 2010 9:00 AM    Prescriptions: METFORMIN HCL 1000 MG TABS (METFORMIN HCL) Take 1 tablet by mouth once a day  #90 x 5   Entered and Authorized by:   Mliss Sax MD   Signed by:   Mliss Sax MD on 04/09/2010   Method used:   Electronically to        [facility] [phone] (retail)       [address]       [place], [place]  [address]       Ph: [phone]       Fax: [phone]   RxID:   [id number]

## 2010-05-07 NOTE — Miscellaneous (Signed)
Summary: [facility]  [facility]   Imported By: Louretta Parma 11/25/2009 14:11:56  _____________________________________________________________________  External Attachment:    Type:   Image     Comment:   External Document

## 2010-05-12 ENCOUNTER — Ambulatory Visit: Payer: Self-pay | Admitting: Internal Medicine

## 2010-05-13 ENCOUNTER — Ambulatory Visit (INDEPENDENT_AMBULATORY_CARE_PROVIDER_SITE_OTHER): Payer: Self-pay | Admitting: Internal Medicine

## 2010-05-13 ENCOUNTER — Other Ambulatory Visit: Payer: Self-pay | Admitting: Internal Medicine

## 2010-05-13 ENCOUNTER — Encounter: Payer: Self-pay | Admitting: Internal Medicine

## 2010-05-13 DIAGNOSIS — I10 Essential (primary) hypertension: Secondary | ICD-10-CM

## 2010-05-13 DIAGNOSIS — E119 Type 2 diabetes mellitus without complications: Secondary | ICD-10-CM

## 2010-05-13 DIAGNOSIS — E785 Hyperlipidemia, unspecified: Secondary | ICD-10-CM

## 2010-05-13 LAB — GLUCOSE, CAPILLARY: Glucose-Capillary: 215 mg/dL — ABNORMAL HIGH (ref 70–99)

## 2010-05-13 MED ORDER — FOLIC ACID 1 MG PO TABS: 1.0000 mg | ORAL_TABLET | Freq: Every day | ORAL | Status: DC

## 2010-05-13 MED ORDER — METFORMIN HCL 1000 MG PO TABS: 1000.0000 mg | ORAL_TABLET | Freq: Every day | ORAL | Status: DC

## 2010-05-13 MED ORDER — INSULIN GLARGINE 100 UNIT/ML ~~LOC~~ SOLN: 20.0000 [IU] | Freq: Every day | SUBCUTANEOUS | Status: DC

## 2010-05-13 MED ORDER — ATENOLOL 50 MG PO TABS: 50.0000 mg | ORAL_TABLET | Freq: Every day | ORAL | Status: DC

## 2010-05-13 MED ORDER — METHYLDOPA 250 MG PO TABS: 250.0000 mg | ORAL_TABLET | Freq: Two times a day (BID) | ORAL | Status: DC

## 2010-05-13 MED ORDER — HYDROCHLOROTHIAZIDE 25 MG PO TABS: 25.0000 mg | ORAL_TABLET | Freq: Every day | ORAL | Status: DC

## 2010-05-13 NOTE — Patient Instructions (Signed)
Please schedule follow up appointment in 3 months

## 2010-05-13 NOTE — Assessment & Plan Note (Signed)
No significant change in A1C, I am suspecting the component of medical noncompliance and diet noncompliance. She is still concerned about the potential side effects of medications on her pregnancy. I have discussed this with the patient in detail and since she is not pregnant yet it would be reasonable to have good control of her diabetes and BP prior to even contemplating pregnancy.

## 2010-05-13 NOTE — Progress Notes (Signed)
  Subjective:    Patient ID: Brenda Bowman, female    DOB: 1960/11/03, 50 y.o.   MRN: [id number]  HPI 50 yo female with PMH outlined below presents to [facility] for regular follow up. She needs refills on her BP medications and prenatal vitamins. She reports compliance with her medications and the recommended diet. She has no concerns at the time. She is still trying to become pregnant and she has been following with the specialist at [facility]. She will have TVUS done to reassess masses growth on the ovaries. She denies any recent hospitalizations or sicknesses but mentions having flu like symptoms for which she was placed on an antibiotic and now she noticed vaginal yeast infection. This has happened to her before and she notes that it usually happens after antibiotic use. She denies any other new discharges, no malodorous discharge, no blood, no dyspareunia, no external vaginal lesions notes. She denies fevers or chills or other systemic concerns, no abdominal or urinary concerns. No changes in appetite or weight.    Review of Systems  Constitutional: Negative.   HENT: Negative.   Respiratory: Negative.   Cardiovascular: Negative.   Gastrointestinal: Negative.   Genitourinary: Positive for vaginal discharge. Negative for dysuria, urgency, frequency, hematuria, flank pain, decreased urine volume, vaginal bleeding, enuresis, difficulty urinating, genital sores, vaginal pain, menstrual problem, pelvic pain and dyspareunia.  Musculoskeletal: Negative.   Psychiatric/Behavioral: Negative.        Objective:   Physical Exam  Constitutional: She appears well-developed and well-nourished. No distress.  HENT:  Head: Normocephalic and atraumatic.  Right Ear: External ear normal.  Left Ear: External ear normal.  Nose: Nose normal.  Mouth/Throat: Oropharynx is clear and moist. No oropharyngeal exudate.  Eyes: Conjunctivae and EOM are normal. Pupils are equal, round, and reactive to light.  Right eye exhibits no discharge. Left eye exhibits no discharge. No scleral icterus.  Neck: Normal range of motion. No JVD present. No tracheal deviation present. No thyromegaly present.  Cardiovascular: Normal rate, regular rhythm and intact distal pulses.  Exam reveals no gallop and no friction rub.   No murmur heard. Pulmonary/Chest: Effort normal and breath sounds normal. No stridor. No respiratory distress. She has no wheezes. She has no rales. She exhibits no tenderness.  Abdominal: Soft. Bowel sounds are normal. She exhibits no distension and no mass. There is no tenderness. There is no rebound and no guarding.  Genitourinary: There is no rash, tenderness, lesion or injury on the right labia. There is no rash, tenderness, lesion or injury on the left labia. No erythema, tenderness or bleeding around the vagina. No foreign body around the vagina. No signs of injury around the vagina. Vaginal discharge found.       Whitish discharge, thick in consistency  Musculoskeletal: Normal range of motion. She exhibits no edema and no tenderness.  Lymphadenopathy:    She has no cervical adenopathy.  Skin: Skin is warm and dry. No rash noted. She is not diaphoretic. No erythema. No pallor.  Psychiatric: She has a normal mood and affect. Her behavior is normal. Judgment and thought content normal.          Assessment & Plan:

## 2010-05-13 NOTE — Assessment & Plan Note (Signed)
Last cholesterol panel done 11/2009 - Cholesterol = 261, TG's = 379, LDL = 141, HDL = 44. Patient should be on statin to get to goal LDL of <100 but she refusing the treatment with statins. We have discussed the potential risks and benefits. And pt's main concern is her desire to get pregnant. Will continue to reassess her FLP and encourage initiating the therapy.

## 2010-05-13 NOTE — Assessment & Plan Note (Signed)
BP is slightly above the goal and again, this is most likely secondary to medical noncompliance. 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 I am not sure that I would even recommend pregnancy given her age and complexity of medical problems but will leave the recommendations up to her ob/gyn specialist.

## 2010-05-14 ENCOUNTER — Telehealth: Payer: Self-pay | Admitting: *Deleted

## 2010-05-14 NOTE — Telephone Encounter (Signed)
Call from pt requesting results of her HbA1C from yesterday.  Pt was called and given result of 7.3.  Pt voiced understanding of.

## 2010-05-24 ENCOUNTER — Other Ambulatory Visit: Payer: Self-pay | Admitting: *Deleted

## 2010-05-24 MED ORDER — CYCLOBENZAPRINE HCL 10 MG PO TABS: 10.0000 mg | ORAL_TABLET | Freq: Two times a day (BID) | ORAL | Status: AC | PRN

## 2010-05-25 ENCOUNTER — Telehealth: Payer: Self-pay | Admitting: *Deleted

## 2010-05-25 NOTE — Telephone Encounter (Signed)
Pt calling again asking about a med that was given to her on last visit.

## 2010-05-28 ENCOUNTER — Telehealth: Payer: Self-pay | Admitting: *Deleted

## 2010-05-28 NOTE — Telephone Encounter (Signed)
Pt called to ask about getting the wrong script, i have called her several times today and left a message

## 2010-06-01 NOTE — Telephone Encounter (Signed)
Dr Aldine Contes to call pt about meds.

## 2010-06-19 LAB — POCT URINALYSIS DIP (DEVICE)
Hgb urine dipstick: NEGATIVE
Nitrite: NEGATIVE
Protein, ur: NEGATIVE mg/dL
Urobilinogen, UA: 0.2 mg/dL (ref 0.0–1.0)
pH: 7 (ref 5.0–8.0)

## 2010-06-22 LAB — POCT URINALYSIS DIP (DEVICE)
Hgb urine dipstick: NEGATIVE
Nitrite: NEGATIVE
Protein, ur: NEGATIVE mg/dL
Urobilinogen, UA: 0.2 mg/dL (ref 0.0–1.0)
pH: 6 (ref 5.0–8.0)

## 2010-06-22 LAB — GC/CHLAMYDIA PROBE AMP, GENITAL: Chlamydia, DNA Probe: NEGATIVE

## 2010-06-22 LAB — WET PREP, GENITAL: Clue Cells Wet Prep HPF POC: NONE SEEN

## 2010-06-22 LAB — GLUCOSE, CAPILLARY: Glucose-Capillary: 236 mg/dL — ABNORMAL HIGH (ref 70–99)

## 2010-06-23 LAB — GLUCOSE, CAPILLARY: Glucose-Capillary: 343 mg/dL — ABNORMAL HIGH (ref 70–99)

## 2010-07-07 LAB — POCT I-STAT, CHEM 8
Calcium, Ion: 1.11 mmol/L — ABNORMAL LOW (ref 1.12–1.32)
Glucose, Bld: 266 mg/dL — ABNORMAL HIGH (ref 70–99)
HCT: 45 % (ref 36.0–46.0)
Hemoglobin: 15.3 g/dL — ABNORMAL HIGH (ref 12.0–15.0)

## 2010-07-12 ENCOUNTER — Telehealth: Payer: Self-pay | Admitting: *Deleted

## 2010-07-12 NOTE — Telephone Encounter (Signed)
Pt calls and requests med for vaginal yeast, states md told her that she had a standing order for the med, please advise  Thanks,h.

## 2010-07-14 ENCOUNTER — Ambulatory Visit (INDEPENDENT_AMBULATORY_CARE_PROVIDER_SITE_OTHER): Payer: Self-pay | Admitting: Internal Medicine

## 2010-07-14 ENCOUNTER — Other Ambulatory Visit (HOSPITAL_COMMUNITY)
Admission: RE | Admit: 2010-07-14 | Discharge: 2010-07-14 | Disposition: A | Discharge status: Home / Self Care | Payer: Self-pay | Source: Ambulatory Visit | Attending: Internal Medicine | Admitting: Internal Medicine

## 2010-07-14 VITALS — BP 176/97 | HR 72 | Temp 98.1°F | Ht 65.0 in | Wt 269.1 lb

## 2010-07-14 DIAGNOSIS — R300 Dysuria: Secondary | ICD-10-CM

## 2010-07-14 DIAGNOSIS — Z124 Encounter for screening for malignant neoplasm of cervix: Secondary | ICD-10-CM

## 2010-07-14 DIAGNOSIS — Z01419 Encounter for gynecological examination (general) (routine) without abnormal findings: Secondary | ICD-10-CM | POA: Insufficient documentation

## 2010-07-14 DIAGNOSIS — I10 Essential (primary) hypertension: Secondary | ICD-10-CM

## 2010-07-14 DIAGNOSIS — N898 Other specified noninflammatory disorders of vagina: Secondary | ICD-10-CM

## 2010-07-14 DIAGNOSIS — E119 Type 2 diabetes mellitus without complications: Secondary | ICD-10-CM

## 2010-07-14 MED ORDER — METHYLDOPA 250 MG PO TABS: 250.0000 mg | ORAL_TABLET | Freq: Two times a day (BID) | ORAL | Status: DC

## 2010-07-14 MED ORDER — METRONIDAZOLE 500 MG PO TABS: 500.0000 mg | ORAL_TABLET | Freq: Two times a day (BID) | ORAL | Status: AC

## 2010-07-14 NOTE — Assessment & Plan Note (Signed)
Patient is well controlled at this time with last HbA1c of 7.3. I will not change anything at this time. Patient asked for a refill of her Lantus which was provided during the consultation.

## 2010-07-14 NOTE — Patient Instructions (Signed)
Bacterial Vaginosis (BV)  Bacterial vaginosis (BV) is a vaginal infection where the normal balance of bacteria in the vagina is disrupted. The normal balance is then replaced by an overgrowth of certain bacteria. There are several different kinds of bacteria that can cause BV. BV is the most common vaginal infection in women of childbearing age.  CAUSES   The cause of BV is not fully understood. BV develops when there is an increase or imbalance of harmful bacteria.    Some activities or behaviors can upset the normal balance of bacteria in the vagina and put women at increased risk including:    Having a new sex partner or multiple sex partners.    Douching.    Using an intrauterine device (IUD) for contraception.    It is not clear what role sexual activity plays in the development of BV. However, women that have never had sexual intercourse are rarely infected with BV.   Women do not get BV from toilet seats, bedding, swimming pools or from touching objects around them.   SYMPTOMS   Grey vaginal discharge.    A fish like odor with discharge, especially after sexual intercourse.    Itching or burning of the vagina and vulva.    Burning or pain with urination.    Some women have no signs or symptoms at all.   DIAGNOSIS   Your caregiver must examine the vagina for signs of BV. Your caregiver will perform lab tests and look at the sample of vaginal fluid through a microscope. They will look for bacteria and abnormal cells (clue cells), a pH test higher than 4.5, and a positive amine test all associated with BV.   RISKS AND COMPLICATIONS   Pelvic inflammatory disease (PID).    Infections following gynecology surgery.    Developing HIV.    Developing herpes virus.   TREATMENT  Sometimes BV will clear up without treatment. However, all women with symptoms of BV should be treated to avoid complications, especially if gynecology surgery is planned. Female partners generally do not need to be treated. However,  BV may spread between female sex partners so treatment is helpful in preventing a recurrence of BV.    BV may be treated with medicines that kill germs (antibiotics). The antibiotics come in either pill or vaginal cream forms. Either can be used with non-pregnant or pregnant women, but the recommended dosages differ. These antibiotics are not harmful to the baby.    BV can recur after treatment. If this happens, a second course of antibiotics will often be prescribed.    Treatment is important for pregnant women. If not treated, BV can cause a premature delivery, especially for a pregnant woman who had a premature birth in the past. All pregnant women who have symptoms of BV should be checked and treated.    For chronic reoccurrence of BV, treatment with a type of prescribed gel vaginally twice a week is helpful.   HOME CARE INSTRUCTIONS   Finish all medication as directed by your caregiver.    Do not have sex until treatment is completed.    Tell your sexual partner that you have a vaginal infection. They should see their caregiver and be treated if they have problems, such as a mild rash or itching.    Practice safe sex. Use condoms. Only have one sex partner.   PREVENTION  Basic prevention steps can help reduce the risk of upsetting the natural balance of bacteria in the vagina and   developing BV:   Do not have sexual intercourse (be abstinent).    Do not douche.    Use all of the medicine prescribed for treatment of BV, even if the signs and symptoms go away.    Tell your sex partner if you have BV. That way, they can be treated, if needed, to prevent reoccurrence.   SEEK MEDICAL CARE IF:   Your symptoms are not improving after three days of treatment.    You have increased discharge, pain or fever.   MAKE SURE YOU:    Understand these instructions.    Will watch your condition.    Will get help right away if you are not doing well or get worse.   FOR MORE INFORMATION  Division of STD Prevention  (DSTDP)  Centers for Disease Control and Prevention  www.cdc.gov/std  Order Publications Online at www.cdc.gov/std/pubs/  CDC National Prevention Information Network (NPIN)  E-mail: info@cdcnpin.org  www.cdcnpin.org    American Social Health Association (ASHA)  [address] [address]  [place], [place] [address]  www.ashastd.org   Document Released: 03/21/2005 Document Re-Released: 06/15/2009  ExitCare Patient Information 2011 [employer].

## 2010-07-14 NOTE — Assessment & Plan Note (Addendum)
Patient has had essential hypertension for quite sometime now. I see that Dr Aldine Contes wanted to start her on Aldomet if her blood pressure is not controlled on HCTZ and atenolol. Her blood pressure is high today. After discussion was decided that patient should add Aldomet at 250 mg twice daily. I would have her come back in 2-3 weeks to recheck blood pressure.

## 2010-07-14 NOTE — Progress Notes (Signed)
  Subjective:    Patient ID: Brenda Bowman, female    DOB: 1960-10-21, 50 y.o.   MRN: [id number]  HPI Patient is a 50 year old female who is here today for routine physical exam. Patient also complains of discharge from her vagina, she states that the discharge is similar to what she had 2 months ago. Patient described it as whitish thick, without any odour, no pain while sexual activity, no blood noted, soaks about one pad a day.  Patient has high blood pressure with blood pressure 176/97 today.  Patient is morbidly obese and has type 2 diabetes. She is currently on Lantus and metformin.  Also complains of a lesion in her vagina and asked me to do sexual transmitted disease tests as she is still sexually active. Patient states that she uses razor for shaving her genital area and might have scraped herself.  No other complaints.   Review of Systems  Constitutional: Negative for fever, activity change and appetite change.  HENT: Negative for sore throat.   Respiratory: Negative for cough and shortness of breath.   Cardiovascular: Negative for chest pain and leg swelling.  Gastrointestinal: Negative for nausea, abdominal pain, diarrhea, constipation and abdominal distention.  Genitourinary: Positive for vaginal discharge. Negative for frequency, hematuria and difficulty urinating.  Neurological: Negative for dizziness and headaches.  Psychiatric/Behavioral: Negative for suicidal ideas and behavioral problems.       Objective:   Physical Exam  Constitutional: She is oriented to person, place, and time. She appears well-developed and well-nourished.  HENT:  Head: Normocephalic and atraumatic.  Eyes: Conjunctivae and EOM are normal. Pupils are equal, round, and reactive to light. No scleral icterus.  Neck: Normal range of motion. Neck supple. No JVD present. No thyromegaly present.  Cardiovascular: Normal rate, regular rhythm, normal heart sounds and intact distal pulses.  Exam  reveals no gallop and no friction rub.   No murmur heard. Pulmonary/Chest: Effort normal and breath sounds normal. No respiratory distress. She has no wheezes. She has no rales.  Abdominal: Soft. Bowel sounds are normal. She exhibits no distension and no mass. There is no tenderness. There is no rebound and no guarding.  Genitourinary:       Red superficial scrape noted over the genital area. Whitish discharge, thick and creamy, without any odour noted throughout vaginal walls. No discharge or bleeding noted from the mouth of cervix, Pap smear and other samples collected for testing. Pap scrape induced slight bleeding.  Musculoskeletal: Normal range of motion. She exhibits no edema and no tenderness.  Lymphadenopathy:    She has no cervical adenopathy.  Neurological: She is alert and oriented to person, place, and time.  Psychiatric: She has a normal mood and affect. Her behavior is normal.          Assessment & Plan:

## 2010-07-15 LAB — WET PREP BY MOLECULAR PROBE: Trichomonas vaginosis: NEGATIVE

## 2010-07-15 LAB — GC/CHLAMYDIA PROBE AMP, GENITAL: Chlamydia, DNA Probe: NEGATIVE

## 2010-07-15 MED ORDER — INSULIN GLARGINE 100 UNIT/ML ~~LOC~~ SOLN: 20.0000 [IU] | Freq: Every day | SUBCUTANEOUS | Status: DC

## 2010-07-16 MED ORDER — FLUCONAZOLE 100 MG PO TABS: 100.0000 mg | ORAL_TABLET | Freq: Once | ORAL | Status: AC

## 2010-07-16 NOTE — Telephone Encounter (Signed)
Please add to med list.

## 2010-07-16 NOTE — Telephone Encounter (Signed)
Not sure she has a "standing order" since in that case she would not need to call  At any rate OK to call her in Diflucan 150 mg, one dose, one refill

## 2010-07-20 ENCOUNTER — Other Ambulatory Visit: Payer: Self-pay | Admitting: Internal Medicine

## 2010-07-20 MED ORDER — FLUCONAZOLE 100 MG PO TABS: 150.0000 mg | ORAL_TABLET | Freq: Every day | ORAL | Status: AC

## 2010-08-10 ENCOUNTER — Ambulatory Visit (INDEPENDENT_AMBULATORY_CARE_PROVIDER_SITE_OTHER): Payer: Self-pay | Admitting: Internal Medicine

## 2010-08-10 ENCOUNTER — Encounter: Payer: Self-pay | Admitting: Internal Medicine

## 2010-08-10 VITALS — BP 166/104 | HR 62 | Temp 98.1°F | Ht 64.5 in | Wt 269.9 lb

## 2010-08-10 DIAGNOSIS — E119 Type 2 diabetes mellitus without complications: Secondary | ICD-10-CM

## 2010-08-10 DIAGNOSIS — I10 Essential (primary) hypertension: Secondary | ICD-10-CM

## 2010-08-10 LAB — GLUCOSE, CAPILLARY: Glucose-Capillary: 169 mg/dL — ABNORMAL HIGH (ref 70–99)

## 2010-08-10 MED ORDER — ATENOLOL 100 MG PO TABS: 100.0000 mg | ORAL_TABLET | Freq: Every day | ORAL | Status: DC

## 2010-08-10 MED ORDER — INSULIN GLARGINE 100 UNIT/ML ~~LOC~~ SOLN: 30.0000 [IU] | Freq: Every day | SUBCUTANEOUS | Status: DC

## 2010-08-10 NOTE — Progress Notes (Signed)
  Subjective:    Patient ID: Brenda Bowman, female    DOB: 09/11/1960, 50 y.o.   MRN: [id number]  HPI Patient is 50 year old female with past medical history outlined below who presents to clinic for regular followup on her blood pressure and diabetes. She reports compliance with her medications but does tell me that her sugar levels are higher than what she would normally expect. She also desires pregnancy. She denies recent sicknesses or hospitalizations, no fevers and chills, no episodes of chest pain or shortness of breath, no abdominal or urinary concerns.   Review of Systems Per HPI    Objective:   Physical Exam Constitutional: Vital signs reviewed.  Patient is a well-developed and well-nourished in no acute distress and cooperative with exam. Alert and oriented x3.  Neck: Supple, Trachea midline normal ROM, No JVD, mass, thyromegaly, or carotid bruit present.  Cardiovascular: RRR, S1 normal, S2 normal, no MRG, pulses symmetric and intact bilaterally Pulmonary/Chest: CTAB, no wheezes, rales, or rhonchi Abdominal: Soft. Non-tender, non-distended, bowel sounds are normal, no masses, organomegaly, or guarding present.  Musculoskeletal: No joint deformities, erythema, or stiffness, ROM full and no nontender Psychiatric: Normal mood and affect. speech and behavior is normal. Judgment and thought content normal. Cognition and memory are normal.         Assessment & Plan:

## 2010-08-10 NOTE — Assessment & Plan Note (Signed)
Uncontrolled blood pressure based on A1c. I am questioning compliance at this time. I have discussed with patient importance of sugar controls and goal A1c. I will increase Lantus to 30 units daily. I have also advised patient to continue checking her blood sugars 2-3 times daily and to bring the glucose monitor with her on next appointment. I also advised her to call us back in 2 weeks to let us know how her sugar levels are controlled today we can readjust medication regimen appropriately.

## 2010-08-10 NOTE — Patient Instructions (Signed)
Please schedule follow up appointment in 3 months

## 2010-08-10 NOTE — Assessment & Plan Note (Signed)
Blood pressure is uncontrolled and at this time my plan is to increase atenolol to 100 mg tablets once daily. In addition I have the device patient to remain compliant with medications and to continue taking methyldopa since she is desiring pregnancy. I have discussed with her that blood pressure control is priority at this time and that likelihood of pregnancy given her age is extremely low. Also I have discussed with patient that pregnancy with uncontrolled hypertension and diabetes are exceptionally dangerous and before planning any pregnancy these things need to be controlled first.

## 2010-08-11 ENCOUNTER — Telehealth: Payer: Self-pay | Admitting: *Deleted

## 2010-08-11 NOTE — Telephone Encounter (Signed)
Pt called stating she was seen in clinic by Dr [facility] yesterday 5/8.  She was concerned about her pulse rate being 62 on visit and in April it was 72.  What do you think about her pulse being lower. Also she wanted you to know she is not taking metformin and methyldopa but sees they are on her med list.  Pt # [phone]

## 2010-08-11 NOTE — Telephone Encounter (Signed)
Call from [facility] stating Rx for lantus directions have inject 30 units in evening,inject 20 units in evening. I read pt notes and advised lantus was increased to 30 units in evening.  Please correct in EPIC record.

## 2010-08-12 NOTE — Telephone Encounter (Signed)
Pt called in again today to see if you had received her message. She is also c/o low back pain without relief from flexeril. She wants to know if you can add another med for her to take to relieve the pain.

## 2010-08-18 ENCOUNTER — Telehealth: Payer: Self-pay | Admitting: *Deleted

## 2010-08-18 NOTE — Telephone Encounter (Signed)
Still unable to reach pt.  Message left

## 2010-08-18 NOTE — Telephone Encounter (Signed)
Pt called with c/o pain in lower abd.  Painful through day. Has tried IBU and flexeril with relief. She wants to be seen this week after 3:00  Pt not available for questions at this time.

## 2010-08-19 ENCOUNTER — Ambulatory Visit (INDEPENDENT_AMBULATORY_CARE_PROVIDER_SITE_OTHER): Payer: Self-pay | Admitting: Internal Medicine

## 2010-08-19 ENCOUNTER — Encounter: Payer: Self-pay | Admitting: Internal Medicine

## 2010-08-19 VITALS — BP 129/83 | HR 61 | Temp 97.9°F | Ht 65.5 in | Wt 269.9 lb

## 2010-08-19 DIAGNOSIS — E119 Type 2 diabetes mellitus without complications: Secondary | ICD-10-CM

## 2010-08-19 DIAGNOSIS — N390 Urinary tract infection, site not specified: Secondary | ICD-10-CM

## 2010-08-19 DIAGNOSIS — N949 Unspecified condition associated with female genital organs and menstrual cycle: Secondary | ICD-10-CM

## 2010-08-19 DIAGNOSIS — R102 Pelvic and perineal pain: Secondary | ICD-10-CM | POA: Insufficient documentation

## 2010-08-19 LAB — POCT URINALYSIS DIPSTICK
Glucose, UA: NEGATIVE
Nitrite, UA: NEGATIVE
pH, UA: 5.5

## 2010-08-19 MED ORDER — FLUCONAZOLE 150 MG PO TABS: ORAL_TABLET | ORAL | Status: DC

## 2010-08-19 NOTE — Patient Instructions (Signed)
Take the diflucan 150 mg 1 tablet today.  If the pain and any symptoms are still there in 3 days you can take the second tablet.   Keep your appointment with the OB/GYN at [facility].

## 2010-08-19 NOTE — Progress Notes (Signed)
  Subjective:    Patient ID: Brenda Bowman, female    DOB: 28-Dec-1960, 50 y.o.   MRN: [id number]  HPI  [name] is a 50 year old woman who presents to clinic today complaining of pain in her groin that started Friday.  She states that "it feels like something is down in her vagina."  She denies any fevers, chills, abdominal pain, dysuria, increased urinary frequency, or odor.  She does have some whitish discharge from the vagina as well as some urinary urgency.  She was treated for BV in April by Dr. Eben Burow with metronidazole.  She states that she finished the medications.  She denies any new sexual partners recently.    Review of Systems    Constitutional: Denies fever, chills, diaphoresis, appetite change and fatigue.  HEENT: Denies photophobia, eye pain, redness, hearing loss, ear pain, congestion, sore throat, rhinorrhea, sneezing, mouth sores, trouble swallowing, neck pain, neck stiffness and tinnitus.   Respiratory: Denies SOB, DOE, cough, chest tightness,  and wheezing.   Cardiovascular: Denies chest pain, palpitations and leg swelling.  Gastrointestinal: Denies nausea, vomiting, abdominal pain, diarrhea, constipation, blood in stool and abdominal distention.  Genitourinary: Positive for vaginal discharge and urinary urgency Denies dysuria, frequency, hematuria, flank pain and difficulty urinating.  Musculoskeletal: Denies myalgias, back pain, joint swelling, arthralgias and gait problem.  Skin: Denies pallor, rash and wound.  Neurological: Denies dizziness, seizures, syncope, weakness, light-headedness, numbness and headaches.  Hematological: Denies adenopathy. Easy bruising, personal or family bleeding history  Psychiatric/Behavioral: Denies suicidal ideation, mood changes, confusion, nervousness, sleep disturbance and agitation   Objective:   Physical Exam    Constitutional: Vital signs reviewed.  Patient is a well-developed and well-nourished woman in no acute distress and  cooperative with exam. Alert and oriented x3.  Head: Normocephalic and atraumatic Ear: TM normal bilaterally Mouth: no erythema or exudates, MMM Eyes: PERRL, EOMI, conjunctivae normal, No scleral icterus.  Neck: Supple, Trachea midline normal ROM, No JVD, mass, thyromegaly, or carotid bruit present.  Cardiovascular: RRR, S1 normal, S2 normal, no MRG, pulses symmetric and intact bilaterally Pulmonary/Chest: CTAB, no wheezes, rales, or rhonchi Abdominal: Soft. Non-tender, non-distended, bowel sounds are normal, no masses, organomegaly, or guarding present.  GU: no CVA tenderness. Pelvic exam showed no exterior lesions or rashes noted.  On speculum exam there is a whitish, yellow clumpy discharge.  Samples were taken for wet prep.  Musculoskeletal: No joint deformities, erythema, or stiffness, ROM full and no nontender Hematology: no cervical, inginal, or axillary adenopathy.  Neurological: A&O x3, Strength is normal and symmetric bilaterally, cranial nerve II-XII are grossly intact, no focal motor deficit, sensory intact to light touch bilaterally.  Skin: Warm, dry and intact. No rash, cyanosis, or clubbing.  Psychiatric: Normal mood and affect. speech and behavior is normal. Judgment and thought content normal. Cognition and memory are normal.   Assessment & Plan:

## 2010-08-19 NOTE — Progress Notes (Deleted)
  Subjective:    Patient ID: Brenda Bowman, female    DOB: [birth year], 50 y.o.   MRN: [id number]  HPI    Review of Systems     Objective:   Physical Exam        Assessment & Plan:

## 2010-08-19 NOTE — Telephone Encounter (Signed)
Tried to reach pt again, message left for her to call clinic

## 2010-08-20 LAB — WET PREP BY MOLECULAR PROBE
Candida species: NEGATIVE
Gardnerella vaginalis: POSITIVE — AB
Trichomonas vaginosis: NEGATIVE

## 2010-08-24 NOTE — Assessment & Plan Note (Addendum)
Her pain appears to be from a vaginal infection.  The discharge appears to be consistent with candidal infection.  We will treat her with diflucan 150 mg one time with a second pill to take in 3 days if she is still symptomatic.  If she continues to be symptomatic after both doses we will empirically treat for BV with metronidazole again.  Urine dipstick was negative so this is not a UTI.

## 2010-08-31 ENCOUNTER — Telehealth: Payer: Self-pay | Admitting: *Deleted

## 2010-08-31 NOTE — Telephone Encounter (Signed)
Pt called with c/o pain under rt breast to back.  Pain is on and off. Onset this week end. Pain is increasing.  She was able to work today.  Pain last about 10 seconds  And is every few hours. No hx of this type pain. No known injury.  No rash noted. Pt seen in clinic last week.  Pt is asking if she could be having a miscarriage.  Pt # [id number]

## 2010-08-31 NOTE — Telephone Encounter (Signed)
I talked with Dr Aldine Contes and she states pt can f/u with her OB/GYN or schedule appointment in clinic for evaluation.  I called pt and she voices understanding.  She will see how she does tonight and call in AM if not better.

## 2010-09-02 ENCOUNTER — Encounter: Payer: Self-pay | Admitting: Internal Medicine

## 2010-09-02 ENCOUNTER — Ambulatory Visit (INDEPENDENT_AMBULATORY_CARE_PROVIDER_SITE_OTHER): Payer: Self-pay | Admitting: Internal Medicine

## 2010-09-02 ENCOUNTER — Telehealth: Payer: Self-pay | Admitting: Dietician

## 2010-09-02 DIAGNOSIS — S61209A Unspecified open wound of unspecified finger without damage to nail, initial encounter: Secondary | ICD-10-CM

## 2010-09-02 DIAGNOSIS — R1011 Right upper quadrant pain: Secondary | ICD-10-CM

## 2010-09-02 DIAGNOSIS — S61219A Laceration without foreign body of unspecified finger without damage to nail, initial encounter: Secondary | ICD-10-CM

## 2010-09-02 NOTE — Patient Instructions (Addendum)
1.  You can use Neosporin antibiotic ointment on the finger with a  bandaid to help it heal.    2. Ice or a heating pad can help with the pain or swelling.  3. You stomach pain is likely related to the muscles or possibly related to gas in your intestine.    4. Follow up as needed.

## 2010-09-02 NOTE — Telephone Encounter (Signed)
Patient called 09/01/10 requesting return call. Returned call at 9 am today 09/02/2010

## 2010-09-02 NOTE — Telephone Encounter (Signed)
Spoke with patient in clinic today for doctor appointment. She is wondering if needle from insulin injections could be causing spasms across her upper abdomen.   I suggested until spasms gone to use back of arm or thigh for her insulin injections.

## 2010-09-02 NOTE — Progress Notes (Signed)
Subjective:    Patient ID: Brenda Bowman, female    DOB: Aug 22, 1960, 50 y.o.   MRN: [id number]  HPI  Brenda Bowman is a 50 year old female who presents to clinic today following a cut on her finger.  She cut herself while working in her kitchen the Thursday prior to her visit.  She states that it bleed briskly but she was able to stop it with pressure.  Since then she noticed some swelling, pain, and redness around the tip of her left index finger where she cut herself.  She states that "it feels like something is in there."  When she cut herself she put pressure on the wound but didn't put any antibiotic ointment or a band aid on her wound at the time.  She denies any drainage from the wound, discoloration of her nail, or loss of sensation in the finger.    She also states that she has had occasional abdominal pain in her RUQ.  She states that it is mild but has gotten worse since Saturday prior to admission.  She states that it comes and goes and has no correlation with meals,.  She has had a cholecystectomy previously.  She states that it sometimes radiates to her back and changes when she moves or takes a deep breath.  She denies any nausea, vomiting, diarrhea, constipation, SOB, or chest pain.   Review of Systems    Constitutional: Denies fever, chills, diaphoresis, appetite change and fatigue.  HEENT: Denies photophobia, eye pain, redness, hearing loss, ear pain, congestion, sore throat, rhinorrhea, sneezing, mouth sores, trouble swallowing, neck pain, neck stiffness and tinnitus.   Respiratory: Denies SOB, DOE, cough, chest tightness,  and wheezing.   Cardiovascular: Denies chest pain, palpitations and leg swelling.  Gastrointestinal: Denies nausea, vomiting, abdominal pain, diarrhea, constipation, blood in stool and abdominal distention.  Genitourinary: Denies dysuria, urgency, frequency, hematuria, flank pain and difficulty urinating.  Musculoskeletal: Denies myalgias, back pain,  joint swelling, arthralgias and gait problem.  Skin: Denies pallor, rash and wound.  Neurological: Denies dizziness, seizures, syncope, weakness, light-headedness, numbness and headaches.  Hematological: Denies adenopathy. Easy bruising, personal or family bleeding history  Psychiatric/Behavioral: Denies suicidal ideation, mood changes, confusion, nervousness, sleep disturbance and agitation   Objective:   Physical Exam    Constitutional: Vital signs reviewed.  Patient is a well-developed and well-nourished, mildly anxious woman in no acute distress and cooperative with exam. Alert and oriented x3.  Head: Normocephalic and atraumatic Ear: TM normal bilaterally Mouth: no erythema or exudates, MMM Eyes: PERRL, EOMI, conjunctivae normal, No scleral icterus.  Neck: Supple, Trachea midline normal ROM, No JVD, mass, thyromegaly, or carotid bruit present.  Cardiovascular: RRR, S1 normal, S2 normal, no MRG, pulses symmetric and intact bilaterally Pulmonary/Chest: CTAB, no wheezes, rales, or rhonchi Abdominal: Soft. Mild tenderness in the RUQ and lower ribs on the right, non-distended, bowel sounds are normal, no masses, organomegaly, or guarding present.  GU: no CVA tenderness Musculoskeletal: No joint deformities, erythema, or stiffness, ROM full and no nontender.  There is a small area of swelling lateral to the left index finger nail.  No drainage or warmth, no blistering noted.  There is mild erythema and no foreign object noted.  There is a healed laceration in the area.  Hematology: no cervical, inginal, or axillary adenopathy.  Neurological: A&O x3, Strenght is normal and symmetric bilaterally, cranial nerve II-XII are grossly intact, no focal motor deficit, sensory intact to light touch bilaterally.  Skin: Warm,  dry and intact. No rash, cyanosis, or clubbing.  Psychiatric: Normal mood and affect. speech and behavior is normal. Judgment and thought content normal. Cognition and memory are  normal.    Assessment & Plan:

## 2010-09-13 DIAGNOSIS — R1011 Right upper quadrant pain: Secondary | ICD-10-CM | POA: Insufficient documentation

## 2010-09-13 DIAGNOSIS — S61219A Laceration without foreign body of unspecified finger without damage to nail, initial encounter: Secondary | ICD-10-CM | POA: Insufficient documentation

## 2010-09-13 NOTE — Assessment & Plan Note (Signed)
Her abdominal pain is musculoskeletal in nature with reproduction of her symptoms with palpation of the lower ribs on the right side.  We will treat symptomatically with ice and tylenol for pain.

## 2010-09-13 NOTE — Assessment & Plan Note (Signed)
It does not appear to be infected currently.  We will have her use ice or heat whichever seems to work best as well as neosporin ointment and watch it.  If it starts to drain or she notices more swelling she should come back to the clinic.  She can use tylenol or ibuprofen for pain.

## 2010-10-05 ENCOUNTER — Other Ambulatory Visit: Payer: Self-pay | Admitting: Internal Medicine

## 2010-10-05 DIAGNOSIS — Z1231 Encounter for screening mammogram for malignant neoplasm of breast: Secondary | ICD-10-CM

## 2010-10-22 ENCOUNTER — Ambulatory Visit (INDEPENDENT_AMBULATORY_CARE_PROVIDER_SITE_OTHER): Payer: Self-pay | Admitting: Internal Medicine

## 2010-10-22 ENCOUNTER — Encounter: Payer: Self-pay | Admitting: Internal Medicine

## 2010-10-22 VITALS — BP 156/108 | HR 75 | Temp 98.4°F | Ht 65.5 in | Wt 257.9 lb

## 2010-10-22 DIAGNOSIS — E785 Hyperlipidemia, unspecified: Secondary | ICD-10-CM

## 2010-10-22 DIAGNOSIS — I10 Essential (primary) hypertension: Secondary | ICD-10-CM

## 2010-10-22 DIAGNOSIS — E119 Type 2 diabetes mellitus without complications: Secondary | ICD-10-CM

## 2010-10-22 LAB — LIPID PANEL
HDL: 44 mg/dL (ref >39)
LDL Cholesterol: 154 mg/dL — ABNORMAL HIGH (ref 0–99)
Triglycerides: 333 mg/dL — ABNORMAL HIGH (ref <150)

## 2010-10-22 LAB — COMPREHENSIVE METABOLIC PANEL
AST: 13 U/L (ref 0–37)
BUN: 16 mg/dL (ref 6–23)
Calcium: 9.9 mg/dL (ref 8.4–10.5)
Chloride: 98 mEq/L (ref 96–112)
Creat: 0.86 mg/dL (ref 0.50–1.10)
Total Bilirubin: 0.4 mg/dL (ref 0.3–1.2)

## 2010-10-22 NOTE — Progress Notes (Signed)
  Subjective:    Patient ID: LESLEE SUIRE, female    DOB: Feb 09, 1961, 50 y.o.   MRN: [id number]  HPI  Patient is 50 year old female with past medical history outlined below who presents to clinic for regular followup on blood pressure, diabetes, cholesterol. She reports compliance with medications as well as recommended diet and exercise. She is desiring a pregnancy and would like to get a pregnancy test today. Her last menstrual period was 10/02/2010. She denies recent sicknesses or hospitalizations, no episodes of chest pain or shortness of breath, no abdominal or urinary concerns.  Review of Systems Constitutional: Denies fever, chills, diaphoresis, appetite change and fatigue.  HEENT: Denies photophobia, eye pain, redness, hearing loss, ear pain, congestion, sore throat, rhinorrhea, sneezing, mouth sores, trouble swallowing, neck pain, neck stiffness and tinnitus.   Respiratory: Denies SOB, DOE, cough, chest tightness,  and wheezing.   Cardiovascular: Denies chest pain, palpitations and leg swelling.  Gastrointestinal: Denies nausea, vomiting, abdominal pain, diarrhea, constipation, blood in stool and abdominal distention.  Genitourinary: Denies dysuria, urgency, frequency, hematuria, flank pain and difficulty urinating.  Musculoskeletal: Denies myalgias, back pain, joint swelling, arthralgias and gait problem.  Skin: Denies pallor, rash and wound.  Neurological: Denies dizziness, seizures, syncope, weakness, light-headedness, numbness and headaches.  Hematological: Denies adenopathy. Easy bruising, personal or family bleeding history  Psychiatric/Behavioral: Denies suicidal ideation, mood changes, confusion, nervousness, sleep disturbance and agitation      Objective:   Physical Exam    Constitutional: Vital signs reviewed.  Patient is a well-developed and well-nourished in no acute distress and cooperative with exam. Alert and oriented x3.  Cardiovascular: RRR, S1 normal, S2  normal, no MRG, pulses symmetric and intact bilaterally Pulmonary/Chest: CTAB, no wheezes, rales, or rhonchi Abdominal: Soft. Non-tender, non-distended, bowel sounds are normal, no masses, organomegaly, or guarding present.  Neurological: A&O x3, Strenght is normal and symmetric bilaterally, cranial nerve II-XII are grossly intact, no focal motor deficit, sensory intact to light touch bilaterally.  Skin: Warm, dry and intact. No rash, cyanosis, or clubbing.  Psychiatric: Normal mood and affect. speech and behavior is normal. Judgment and thought content normal. Cognition and memory are normal.       Assessment & Plan:

## 2010-10-22 NOTE — Assessment & Plan Note (Signed)
Patient is currently not taking any medications for lowering cholesterol. We will check fasting lipid panel today as well as liver function tests and will initiate medication if indicated.

## 2010-10-22 NOTE — Assessment & Plan Note (Signed)
Diabetes is not controlled. Last A1c was over 9 and we'll check A1c today. Patient did not bring glucose monitor with her. I advised her to bring glucose monitor with her to her next appointment so we can reassess it and change the regimen if indicated. Patient reports taking Lantus as well as metformin. I have examined her feet today and the results of the examination are within normal limits. She is up to date on her yearly ophthalmology appointments. We have discussed diet regimen as well as regular daily exercising. Patient will have regular followup appointment schedule in 3 months at which point we'll have to reassess A1c and glucose control.

## 2010-10-22 NOTE — Assessment & Plan Note (Signed)
Blood pressure is not at goal however patient tells me she checks her blood pressure at home regularly and the readings are usually less than 130/80. She is taking atenolol and HCTZ. I will make no changes to her medication regimen today however I have advised her to continue checking her blood pressure at home and to write it down and bring it to her next appointment so we can reevaluate it and decide on further regimen. If her blood pressure is persistently higher than 140/90 we will have to add a new medication for blood pressure control lisinopril would be the preferred option

## 2010-10-25 ENCOUNTER — Other Ambulatory Visit: Payer: Self-pay | Admitting: *Deleted

## 2010-10-26 MED ORDER — PRENATAL 1 PLUS 1 65-1 MG PO TABS: 1.0000 | ORAL_TABLET | Freq: Every day | ORAL | Status: DC

## 2010-10-26 MED ORDER — FOLIC ACID 1 MG PO TABS: 1.0000 mg | ORAL_TABLET | Freq: Every day | ORAL | Status: DC

## 2010-10-27 ENCOUNTER — Other Ambulatory Visit: Payer: Self-pay | Admitting: *Deleted

## 2010-10-27 NOTE — Telephone Encounter (Signed)
Pt called today asking for the results of her lab work. As several results are elevated, I would like you to call pt with results. Pt # [id number]

## 2010-10-27 NOTE — Telephone Encounter (Signed)
[facility] pharmacy wants to know if they can change to the Regular Prenatal Plus from Prenatal 1 Plus 1;  Her insurance will not pay for the 1 plus 1?   Thanks Please advise

## 2010-11-02 ENCOUNTER — Encounter: Payer: Self-pay | Admitting: Internal Medicine

## 2010-11-02 NOTE — Telephone Encounter (Signed)
Dr. Aldine Contes can u clarify Prenatal Plus tabs?  Thanks

## 2010-11-02 NOTE — Telephone Encounter (Signed)
Hey Brenda Homes  Do you think we can send her a letter to her home since I tried one time and no answer.  Thank you Sherlon Handing

## 2010-11-04 MED ORDER — PRENATAL PLUS DHA 7-0.4-100 MG PO TABS: 1.0000 | ORAL_TABLET | Freq: Every day | ORAL | Status: DC

## 2010-11-05 ENCOUNTER — Other Ambulatory Visit: Payer: Self-pay

## 2010-11-05 ENCOUNTER — Other Ambulatory Visit: Payer: Self-pay | Admitting: *Deleted

## 2010-11-05 NOTE — Telephone Encounter (Signed)
Pharmacy called said that they only carry Prenate Plus or Prenatal with DHA.  Do not carry the requested composition.

## 2010-11-08 ENCOUNTER — Telehealth: Payer: Self-pay | Admitting: *Deleted

## 2010-11-08 NOTE — Telephone Encounter (Signed)
Pt called stating Sunday at 0400 she got up itching and had bumps over body.  She took 2 benadryl and it resolved.   Today after lunch the itching and bumps returned.  She took  Two more benadryl.  Denies SOB and feels okay. What else should she do?  I talked with Dr Rogelia Boga and she agrees with plan. May take benadryl.   Pt will call and be seen if not better or any new symptoms. Pt has not started any new meds, detergents or foods.

## 2010-11-10 MED ORDER — PRENATAL PLUS DHA 7-0.4-100 MG PO TABS: 1.0000 | ORAL_TABLET | Freq: Every day | ORAL | Status: DC

## 2010-11-15 ENCOUNTER — Ambulatory Visit (HOSPITAL_COMMUNITY): Payer: Self-pay

## 2010-11-23 ENCOUNTER — Other Ambulatory Visit: Payer: Self-pay | Admitting: Internal Medicine

## 2010-12-16 ENCOUNTER — Encounter: Payer: Self-pay | Admitting: Internal Medicine

## 2010-12-22 ENCOUNTER — Encounter: Payer: Self-pay | Admitting: Internal Medicine

## 2011-01-04 ENCOUNTER — Ambulatory Visit (INDEPENDENT_AMBULATORY_CARE_PROVIDER_SITE_OTHER): Payer: Self-pay | Admitting: *Deleted

## 2011-01-04 DIAGNOSIS — Z23 Encounter for immunization: Secondary | ICD-10-CM

## 2011-01-07 LAB — WET PREP, GENITAL: Trich, Wet Prep: NONE SEEN

## 2011-01-07 LAB — POCT URINALYSIS DIP (DEVICE)
Bilirubin Urine: NEGATIVE
Hgb urine dipstick: NEGATIVE
Ketones, ur: NEGATIVE mg/dL
Nitrite: NEGATIVE
pH: 7 (ref 5.0–8.0)

## 2011-02-10 ENCOUNTER — Other Ambulatory Visit: Payer: Self-pay | Admitting: *Deleted

## 2011-02-10 DIAGNOSIS — R102 Pelvic and perineal pain: Secondary | ICD-10-CM

## 2011-02-10 MED ORDER — FLUCONAZOLE 150 MG PO TABS: ORAL_TABLET | ORAL | Status: DC

## 2011-02-10 NOTE — Telephone Encounter (Signed)
Pt feels she has a vaginal  yeast infection.  Denies buring.   Notes itching and  white cheesy d/c for 4 days.  She is using petroleum jelly for relief now. Pt # [id number]

## 2011-03-03 ENCOUNTER — Telehealth: Payer: Self-pay | Admitting: *Deleted

## 2011-03-03 NOTE — Telephone Encounter (Signed)
Pt calls and states she is having a problem getting her lantus at [facility], i called [facility], spoke w/ theresa and she will call pt and update her and provide samples if needed.

## 2011-03-03 NOTE — Telephone Encounter (Signed)
Pt calls w/ questions about insulin, metformin and A1C, appt is scheduled for 12/3 at 1345 per sharonb. W/ dr patel. F/u appt was to been in oct but pt missed appts.

## 2011-03-07 ENCOUNTER — Ambulatory Visit (INDEPENDENT_AMBULATORY_CARE_PROVIDER_SITE_OTHER): Payer: Self-pay | Admitting: Internal Medicine

## 2011-03-07 ENCOUNTER — Ambulatory Visit: Payer: Self-pay | Admitting: Internal Medicine

## 2011-03-07 ENCOUNTER — Encounter: Payer: Self-pay | Admitting: Internal Medicine

## 2011-03-07 VITALS — BP 168/101 | HR 71 | Temp 97.8°F | Ht 66.0 in | Wt 264.9 lb

## 2011-03-07 DIAGNOSIS — N9489 Other specified conditions associated with female genital organs and menstrual cycle: Secondary | ICD-10-CM

## 2011-03-07 DIAGNOSIS — E119 Type 2 diabetes mellitus without complications: Secondary | ICD-10-CM

## 2011-03-07 LAB — GLUCOSE, CAPILLARY: Glucose-Capillary: 171 mg/dL — ABNORMAL HIGH (ref 70–99)

## 2011-03-07 LAB — POCT GLYCOSYLATED HEMOGLOBIN (HGB A1C): Hemoglobin A1C: 8.7

## 2011-03-07 MED ORDER — INSULIN GLARGINE 100 UNIT/ML ~~LOC~~ SOLN: 35.0000 [IU] | Freq: Every day | SUBCUTANEOUS | Status: DC

## 2011-03-07 NOTE — Assessment & Plan Note (Signed)
Lab Results  Component Value Date   HGBA1C 8.7 03/07/2011   HGBA1C 6.8 11/24/2009   POCGLU 215 05/13/2010   CREATININE 0.86 10/22/2010   CREATININE 0.79 10/29/2009   MICROALBUR 0.89 11/24/2009   MICRALBCREAT 5.6 11/24/2009   CHOL 265* 10/22/2010   HDL 44 10/22/2010   TRIG 333* 10/22/2010    Last eye exam and foot exam:    Component Value Date/Time   HMDIABFOOTEX done 07/30/2009    Assessment: Diabetes control: not controlled Progress toward goals: deteriorated Barriers to meeting goals: no barriers identified  Plan: Diabetes treatment: I will increase the dose of Lantus to 35 units daily. followup in 3-4 months with repeat HbA1c Refer to: none Instruction/counseling given: reminded to get eye exam, reminded to bring blood glucose meter & log to each visit and reminded to bring medications to each visit

## 2011-03-07 NOTE — Progress Notes (Signed)
  Subjective:    Patient ID: Brenda Bowman, female    DOB: 12-07-1960, 50 y.o.   MRN: [id number]  HPI Brenda Bowman is a pleasant 50 year old woman with past with history of type II DM, hypertension, Ovarian mass who comes the clinic for regular followup visit. She was seen in July 2012 at which time her HbA1c was 8.3- which is 8.7 today. Was followed by a gynecologist at [facility] who referred her to [facility] due to concern of a ovarian mass- which was likely not found malignant- supposed to be PCOS. Now she is trying to get pregnant- although she was advised to get her ovaries removed- but she wants to get a baby before doing that. She was trying to order Clomiphine citrate from the Internet and try to induce ovulation and get pregnant. I advised her against it and talked with her in detail about 30 minutes explaining the implications of unsupervised medication use. Advised her to have at least one appointment with infertility clinic or OB/GYN and then followup the recommendations for further ovulation induction. Also described her in detail about the pathophysiology of PCOS and ovulation and she verbalized understanding to the end and said that she'll make an appointment with the fertility clinic.  Although she denies any fever, chills, nausea vomiting, abdominal pain, chest pain, short of breath.   Review of Systems    as per history of present illness, all other systems reviewed and negative. Objective:   Physical Exam Vitals: Reviewed and stable. General: NAD HEENT: PERRL, EOMI, no scleral icterus Cardiac: RRR, no rubs, murmurs or gallops Pulm: clear to auscultation bilaterally, moving normal volumes of air Abd: soft, nontender, nondistended, BS present Ext: warm and well perfused, no pedal edema Neuro: alert and oriented X3, cranial nerves II-XII grossly intact, strength and sensation to light touch equal in bilateral upper and lower extremities          Assessment & Plan:

## 2011-03-07 NOTE — Assessment & Plan Note (Signed)
Followed with [facility] with oncologist and gynecologist at [facility] in [place]- as detailed in history of present illness. I discussed in detail for her to make an appointment with infertility clinic or OB/GYN for further followup for ovulation induction and other measures for helping her getting pregnant. She was understanding and will do so.

## 2011-03-07 NOTE — Patient Instructions (Signed)
Please make a followup appointment in 3-4 months to check hemoglobin A1c. Please try to make an appointment with gynecologist for management of your infertility as it would help with her efforts for getting pregnant. Please take all medications regularly. I would increase the dose of Lantus 35 units today. Please try to do exercise and lose weight which would help all medical problems you have right now.

## 2011-03-11 ENCOUNTER — Ambulatory Visit: Payer: Self-pay | Admitting: Obstetrics & Gynecology

## 2011-04-01 ENCOUNTER — Encounter: Payer: Self-pay | Admitting: Internal Medicine

## 2011-04-01 ENCOUNTER — Other Ambulatory Visit: Payer: Self-pay | Admitting: Internal Medicine

## 2011-04-01 ENCOUNTER — Telehealth: Payer: Self-pay | Admitting: *Deleted

## 2011-04-01 DIAGNOSIS — E119 Type 2 diabetes mellitus without complications: Secondary | ICD-10-CM

## 2011-04-01 NOTE — Telephone Encounter (Addendum)
Call from pt said that she is experiencing some nausea.  Had stopped her Metformin but has restarted it in November.  Wants to know if her meds may be causing this.  No fevers.  Is having diarrhea.  Has been having the symptoms off and on .  Having loose bowels instead of a lot of diarrhea.  Nausea started the week before Christmas.  Feels nausea 20 minutes after eating.  Abdominal pain as well.  No heartburn.  Uses the [facility] [address].  [phone] is here she can be reached.  Angelina Ok, RN 04/01/2011 10:36 PM  Spoke with Dr. Phillips Odor -pt to stop Metformin and to come in for an appointment next week.  Pt said that she has been having problems getting the Lantus pens to work that [facility].  Has called them today for another refill.  If unable to get replacement pens would like to get the vials until the new supply comes in.  Will call back this pm if pharmacy cannot get the pens for her.  Angelina Ok, RN 04/01/2011 11:16 AM.

## 2011-04-01 NOTE — Telephone Encounter (Signed)
Hold Metformin. Check sugars twice daily. Make appointment for next week for evaluation.

## 2011-04-01 NOTE — Progress Notes (Signed)
Patient called saying that her lantus solostar pen is not working. I called the pharmacy and re-prescribed lantus solostar again. Patient will pick it up tonight. If she cannot, the pharmacist suggested dialing up the dose by couple of units to see if the pen works. Advised this to the patient as well.

## 2011-04-06 ENCOUNTER — Ambulatory Visit: Payer: Self-pay | Admitting: Obstetrics & Gynecology

## 2011-04-07 ENCOUNTER — Ambulatory Visit (INDEPENDENT_AMBULATORY_CARE_PROVIDER_SITE_OTHER): Payer: Self-pay | Admitting: Internal Medicine

## 2011-04-07 ENCOUNTER — Encounter: Payer: Self-pay | Admitting: Internal Medicine

## 2011-04-07 ENCOUNTER — Other Ambulatory Visit: Payer: Self-pay | Admitting: Internal Medicine

## 2011-04-07 DIAGNOSIS — Z3200 Encounter for pregnancy test, result unknown: Secondary | ICD-10-CM | POA: Insufficient documentation

## 2011-04-07 DIAGNOSIS — J029 Acute pharyngitis, unspecified: Secondary | ICD-10-CM

## 2011-04-07 DIAGNOSIS — E119 Type 2 diabetes mellitus without complications: Secondary | ICD-10-CM

## 2011-04-07 LAB — RPR

## 2011-04-07 LAB — POCT URINE PREGNANCY: Preg Test, Ur: NEGATIVE

## 2011-04-07 MED ORDER — INSULIN GLARGINE 100 UNIT/ML ~~LOC~~ SOLN: 40.0000 [IU] | Freq: Every day | SUBCUTANEOUS | Status: DC

## 2011-04-07 NOTE — Progress Notes (Signed)
Subjective:   Patient ID: [name] female   DOB: 06-27-1960 51 y.o.   MRN: [id number]  HPI: Ms.Brenda Bowman is a 51 y.o. woman with history significant for diabetes mellitus who presents for acute visit regarding her diabetes therapy.  [name] states that she has had increased abdominal discomfort and nausea after her metformin was started in November. She states that the pain begins approximately 20-60 minutes after taking metformin. It is associated with nausea. The pain is described as a crampy feeling that lasts approximately one hour. She does not have the pain at night or when she does not take metformin. She recently called the clinic on December 28 and was told to discontinue taking metformin. Since that time she has had resolution of her abdominal discomfort though still has occasional nausea. She had difficulty with her Lantus Pamelor that has since resolved she has had no issues accessing Lantus. She states that her blood glucose over the past 5 days has ranged from the 180s to 260s. She states she generally checks her blood glucose before eating approximately 2 times daily. She has not had a low less than 80 in quite some time. She made the current days appointment to address her glucose control after discontinuing metformin.  [name] also states that her sexual partner was recently unfaithful to her and she is concerned about having a sexually transmitted infection. Specifically she is concerned about the sore throat she's been having for approximately 2 days.She states her partner was treated approximately December 28 with some medication she does not know. She thinks it was for a sexually transmitted infection. They have been trying to have a child and has had unprotected intercourse since that time. She denies any fevers or chills or malaise. She endorses a nonproductive cough. She denies any genitourinary symptoms such as discharge, dysuria, urinary  frequency, urgency, flank pain dyspareunia, abdominal pain or malodorous smell.   Denies chest pain, abdominal pain, shortness of breath, fever, chills, night sweats, current nausea, vomiting, diarrhea, constipation, weakness, fatigue, malaise or lymphadenopathy.   Past Medical History  Diagnosis Date  . Hypertension   . Diabetes mellitus   . HLD (hyperlipidemia)   . 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 HYPERLIPIDEMIA 07/30/2009   Current Outpatient Prescriptions  Medication Sig Dispense Refill  . atenolol (TENORMIN) 100 MG tablet Take 1 tablet (100 mg total) by mouth daily.  30 tablet  11  . cyclobenzaprine (FLEXERIL) 10 MG tablet Take 1 tablet (10 mg total) by mouth 2 (two) times daily as needed for Muscle spasms.  30 tablet  1  . folic acid (FOLVITE) 1 MG tablet Take 1 tablet (1 mg total) by mouth daily.  90 tablet  2  . glucose blood (TRUETRACK TEST) test strip Use to test blood sugar twice a day (Before breakfast and dinner.)       . hydrochlorothiazide 25 MG tablet TAKE ONE TABLET BY MOUTH EVERY DAY  31 tablet  11  . insulin glargine (LANTUS SOLOSTAR) 100 UNIT/ML injection Inject 40 Units into the skin at bedtime.  10 mL  11  . Insulin Pen Needle (SURE COMFORT PEN NEEDLES) 31G X 5 MM MISC Use to inject insulin once a day.       . Lancets 30G MISC Use to test blood sugar 2 times a day.       . Prenatal MV-Min-Fe  Cbn-FA-DHA (PRENATAL PLUS DHA) 7-0.4-100 MG TABS 1 tablet by Per post-pyloric tube route daily.  30 each  11  . metFORMIN (GLUCOPHAGE) 1000 MG tablet Take 1 tablet (1,000 mg total) by mouth daily.  30 tablet  7   Family History  Problem Relation Age of Onset  . Stroke Neg Hx   . Miscarriages / Stillbirths Neg Hx   . Heart disease Neg Hx   . Cancer Neg Hx    History   Social History  . Marital Status: Divorced    Spouse Name: N/A    Number of Children: N/A  . Years of  Education: N/A   Social History Main Topics  . Smoking status: Never Smoker   . Smokeless tobacco: None  . Alcohol Use: No  . Drug Use: No  . Sexually Active: Yes   Other Topics Concern  . None   Social History Narrative   Financial assistance approved for 100% discount at [facility] and has GCC[facility] cardDeborah [facility]  February 02, 2010 12:30 PM    Objective:  Physical Exam: Filed Vitals:   04/07/11 1329  BP: 171/102  Pulse: 68  Temp: 98.4 F (36.9 C)  TempSrc: Oral  Height: 5' 5.5" (1.664 m)  Weight: 267 lb (121.11 kg)   Constitutional: Vital signs reviewed.  Patient is an  Obese woman in no acute distress and cooperative with exam. Alert and oriented x3.  Head: Normocephalic and atraumatic Mouth: no erythema or exudates, MMM Eyes: PERRL, EOMI, conjunctivae normal, No scleral icterus.  Neck: Supple, nontender to palpation. Cardiovascular: RRR, S1 normal, S2 normal, no MRG, pulses symmetric and intact bilaterally Pulmonary/Chest: CTAB, no wheezes, rales, or rhonchi Abdominal: Soft. Non-tender, non-distended, bowel sounds are normal, no masses, organomegaly, or guarding present.  GU: no CVA tenderness Musculoskeletal: No joint deformities, erythema, or stiffness, ROM full and no nontender HNeurological: A&O x3, strength is grossly normal. cranial nerve II-XII are grossly intact, no focal motor deficit, sensory intact to light touch bilaterally.  Skin: Warm, dry and intact. No rash, cyanosis, or clubbing.  Psychiatric: Normal mood and affect. speech and behavior is normal. Judgment and thought content normal. Cognition and memory are normal.   Assessment & Plan:

## 2011-04-07 NOTE — Patient Instructions (Addendum)
Increase her Lantus to 40 units daily.  We will check your blood for potential sexually transmitted infections.   If any of your lab results are abnormal, we will contact you by phone or send you a letter. If they are normal, we will not contact you, but will be happy to discuss them at your next clinic appointment.  Return to clinic to see Your PCP in 2 weeks. Please bring your glucometer to this appointment and check your blood glucose at least twice daily and the week prior to this visit. Please bring all your medications to your next clinic appointment.    Safer Sex Your caregiver wants you to have this information about the infections that can be transmitted from sexual contact and how to prevent them. The idea behind safer sex is that you can be sexually active, and at the same time reduce the risk of giving or getting a sexually transmitted disease (STD). Every person should be aware of how to prevent him or herself and his or her sex partner from getting an STD. CAUSES OF STDS STDs are transmitted by sharing body fluids, which contain viruses and bacteria. The following fluids all transmit infections during sexual intercourse and sex acts:  Semen.   Saliva.   Urine.   Blood.   Vaginal mucus.  Examples of STDs include:  Chlamydia.   Gonorrhea.   Genital herpes.   Hepatitis B.   Human immunodeficiency virus or acquired immunodeficiency syndrome (HIV or AIDS).   Syphilis.   Trichomonas.   Pubic lice.   Human papillomavirus (HPV), which may include:   Genital warts.   Cervical dysplasia.   Cervical cancer (can develop with certain types of HPV).  SYMPTOMS  Sexual diseases often cause few or no symptoms until they are advanced, so a person can be infected and spread the infection without knowing it. Some STDs respond to treatment very well. Others, like HIV and herpes, cannot be cured, but are treated to reduce their effects. Specific symptoms  include:  Abnormal vaginal discharge.   Irritation or itching in and around the vagina, and in the pubic hair.   Pain during sexual intercourse.   Bleeding during sexual intercourse.   Pelvic or abdominal pain.   Fever.   Growths in and around the vagina.   An ulcer in or around the vagina.   Swollen glands in the groin area.  DIAGNOSIS   Blood tests.   Pap test.   Culture test of abnormal vaginal discharge.   A test that applies a solution and examines the cervix with a lighted magnifying scope (colposcopy).   A test that examines the pelvis with a lighted tube, through a small incision (laparoscopy).  TREATMENT  The treatment will depend on the cause of the STD.  Antibiotic treatment by injection, oral, creams, or suppositories in the vagina.   Over-the-counter medicated shampoo, to get rid of pubic lice.   Removing or treating growths with medicine, freezing, burning (electrocautery), or surgery.   Surgery treatment for HPV of the cervix.   Supportive medicines for herpes, HIV, AIDS, and hepatitis.  Being careful cannot eliminate all risk of infection, but sex can be made much safer. Safe sexual practices include body massage and gentle touching. Masturbation is safe, as long as body fluids do not contact skin that has sores or cuts. Dry kissing and oral sex on a man wearing a latex condom or on a woman wearing a female condom is also safe. Slightly less safe  is intercourse while the man wears a latex condom or wet kissing. It is also safer to have one sex partner that you know is not having sex with anyone else. LENGTH OF ILLNESS An STD might be treated and cured in a week, sometimes a month, or more. And it can linger with symptoms for many years. STDs can also cause damage to the female organs. This can cause chronic pain, infertility, and recurrence of the STD, especially herpes, hepatitis, HIV, and HPV. HOME CARE INSTRUCTIONS AND PREVENTION  Alcohol and  recreational drugs are often the reason given for not practicing safer sex. These substances affect your judgment. Alcohol and recreational drugs can also impair your immune system, making you more vulnerable to disease.   Do not engage in risky and dangerous sexual practices, including:   Vaginal or anal sex without a condom.   Oral sex on a man without a condom.   Oral sex on a woman without a female condom.   Using saliva to lubricate a condom.   Any other sexual contact in which body fluids or blood from one partner contact the other partner.   You should use only latex condoms for men and water soluble lubricants. Petroleum based lubricants or oils used to lubricate a condom will weaken the condom and increase the chance that it will break.   Think very carefully before having sex with anyone who is high risk for STDs and HIV. This includes IV drug users, people with multiple sexual partners, or people who have had an STD, or a positive hepatitis or HIV blood test.   Remember that even if your partner has had only one previous partner, their previous partner might have had multiple partners. If so, you are at high risk of being exposed to an STD. You and your sex partner should be the only sex partners with each other, with no one else involved.   A vaccine is available for hepatitis B and HPV through your caregiver or the Public Health Department. Everyone should be vaccinated with these vaccines.   Avoid risky sex practices. Sex acts that can break the skin make you more likely to get an STD.  SEEK MEDICAL CARE IF:   If you think you have an STD, even if you do not have any symptoms. Contact your caregiver for evaluation and treatment, if needed.   You think or know your sex partner has acquired an STD.   You have any of the symptoms mentioned above.  Document Released: 04/28/2004 Document Revised: 12/01/2010 Document Reviewed: 02/18/2009 [facility] Patient Information 2012  [place], [place].

## 2011-04-07 NOTE — Assessment & Plan Note (Addendum)
Overall diabetes is poorly controlled. Last A1c was 8.7 December. Current blood glucose per the patient has also been elevated. Given her intolerance to metformin this medication will be discontinued. We will provide Lantus 40 units daily. The patient is to have a detailed log of her blood glucose for the next 2 weeks especially in the week prior to her clinic visit. She is to bring this log to her next clinic visit. Her followup appointment in 2 weeks we will discuss whether or not the Lantus increase has provided good enough control. We will consider adding meal coverage insulin if not.

## 2011-04-07 NOTE — Assessment & Plan Note (Signed)
Brenda Bowman is at risk for potential sexually transmitted infection after report of her partner's infidelity. She is most concerned about pharyngitis. She denies concern for sexually transmitted infection in her genital area. I discussed with [name] in our lab the best way to go about analyzing chlamydial or GC pharyngitis. We ordered culture for GC and Chlamydia after throat swab today. Also check RPR and HIV given risk. We'll have the patient come back in 2 weeks to see her PCP. If pharyngitis is unresolved consider treatment with IM ceftriaxone and azithromycin. -- Chlamydial and GC swab of oropharynx -- RPR -- HIV antibody -- Followup with PCP in 2 weeks -- Safe sexual practices counseling given.

## 2011-04-08 LAB — HIV ANTIBODY (ROUTINE TESTING W REFLEX): HIV: NONREACTIVE

## 2011-04-08 NOTE — Progress Notes (Signed)
I discussed patient with resident Dr. Raisch, and I agree with the plans as outlined in his note. 

## 2011-04-21 ENCOUNTER — Encounter: Payer: Self-pay | Admitting: Internal Medicine

## 2011-04-21 ENCOUNTER — Ambulatory Visit (INDEPENDENT_AMBULATORY_CARE_PROVIDER_SITE_OTHER): Payer: Self-pay | Admitting: Internal Medicine

## 2011-04-21 DIAGNOSIS — N9489 Other specified conditions associated with female genital organs and menstrual cycle: Secondary | ICD-10-CM

## 2011-04-21 DIAGNOSIS — I10 Essential (primary) hypertension: Secondary | ICD-10-CM

## 2011-04-21 DIAGNOSIS — E119 Type 2 diabetes mellitus without complications: Secondary | ICD-10-CM

## 2011-04-21 DIAGNOSIS — Z23 Encounter for immunization: Secondary | ICD-10-CM

## 2011-04-21 MED ORDER — FOLIC ACID 1 MG PO TABS: 1.0000 mg | ORAL_TABLET | Freq: Every day | ORAL | Status: DC

## 2011-04-21 MED ORDER — PRENATAL PLUS DHA 7-0.4-100 MG PO TABS: 1.0000 | ORAL_TABLET | Freq: Every day | ORAL | Status: DC

## 2011-04-21 NOTE — Assessment & Plan Note (Deleted)
Blood pressure well-controlled. Continue current regimen. BP Readings from Last 3 Encounters:  04/21/11 120/80  04/07/11 171/102  03/07/11 168/101

## 2011-04-21 NOTE — Assessment & Plan Note (Signed)
Pneumococcus vaccination given today 

## 2011-04-21 NOTE — Progress Notes (Signed)
Subjective:   Patient ID: CHERL GORNEY female   DOB: [birth year] 51 y.o.   MRN: [id number]  HPI: Ms.Feather E Fenderson is a 51 y.o.  female with past medical history significant as outlined below who presented to the clinic for a followup. The patient was evaluated on January 3 in the clinic were heard almost discontinued due to side effects and patient's Lantus was increased to 40 units daily. She was supposed to check her blood sugars on a regular basis and bring in the meter which she did not do today. She noted her blood sugars before breakfast around 140 but never below 80. She further reports that her throat pain has resolved at this point.    Past Medical History  Diagnosis Date  . Hypertension   . Diabetes mellitus   . HLD (hyperlipidemia)   . 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 HYPERLIPIDEMIA 07/30/2009   Current Outpatient Prescriptions  Medication Sig Dispense Refill  . atenolol (TENORMIN) 100 MG tablet Take 1 tablet (100 mg total) by mouth daily.  30 tablet  11  . cyclobenzaprine (FLEXERIL) 10 MG tablet Take 1 tablet (10 mg total) by mouth 2 (two) times daily as needed for Muscle spasms.  30 tablet  1  . folic acid (FOLVITE) 1 MG tablet Take 1 tablet (1 mg total) by mouth daily.  90 tablet  2  . glucose blood (TRUETRACK TEST) test strip Use to test blood sugar twice a day (Before breakfast and dinner.)       . hydrochlorothiazide 25 MG tablet TAKE ONE TABLET BY MOUTH EVERY DAY  31 tablet  11  . insulin glargine (LANTUS SOLOSTAR) 100 UNIT/ML injection Inject 40 Units into the skin at bedtime.  10 mL  11  . Insulin Pen Needle (SURE COMFORT PEN NEEDLES) 31G X 5 MM MISC Use to inject insulin once a day.       . Lancets 30G MISC Use to test blood sugar 2 times a day.       . metFORMIN (GLUCOPHAGE) 1000 MG tablet Take 1 tablet (1,000 mg total) by mouth daily.  30 tablet  7  .  Prenatal MV-Min-Fe Cbn-FA-DHA (PRENATAL PLUS DHA) 7-0.4-100 MG TABS 1 tablet by Per post-pyloric tube route daily.  30 each  11   Family History  Problem Relation Age of Onset  . Stroke Neg Hx   . Miscarriages / Stillbirths Neg Hx   . Heart disease Neg Hx   . Cancer Neg Hx    History   Social History  . Marital Status: Divorced    Spouse Name: N/A    Number of Children: N/A  . Years of Education: N/A   Social History Main Topics  . Smoking status: Never Smoker   . Smokeless tobacco: None  . Alcohol Use: No  . Drug Use: No  . Sexually Active: Yes   Other Topics Concern  . None   Social History Narrative   Financial assistance approved for 100% discount at [facility] and has [facility]Deb[facility]ah [facility]  February 02, 2010 12:30 PM   Review of Systems: Constitutional: Denies fever, chills, diaphoresis, appetite change and fatigue.  HEENT: Denies  congestion, sore throat, rhinorrhea, sneezing, mouth sores, trouble swallowing, neck pain, neck stiffness and tinnitus.   Respiratory: Denies SOB, DOE, cough, chest tightness,  and wheezing.   Cardiovascular: Denies chest pain, palpitations and leg  swelling.  Gastrointestinal: Denies nausea, vomiting, abdominal pain, diarrhea,  Skin: Denies pallor, rash and wound.    Objective:  Physical Exam: Filed Vitals:   04/21/11 1458  BP: 144/88  Pulse: 75  Temp: 98 F (36.7 C)  TempSrc: Oral  Height: 5\' 6"  (1.676 m)  Weight: 259 lb 8 oz (117.708 kg)  SpO2: 96%   Constitutional: Vital signs reviewed.  Patient is a well-developed and well-nourished  in no acute distress and cooperative with exam. Alert and oriented x3.  Mouth: no erythema or exudates, MMM Neck: Supple,  Cardiovascular: RRR, S1 normal, S2 normal, no MRG, pulses symmetric and intact bilaterally Pulmonary/Chest: CTAB, no wheezes, rales, or rhonchi Abdominal: Soft. Non-tender, non-distended, bowel sounds are normal,  Neurological: A&O x3,  Skin: Warm, dry and intact. No rash,  cyanosis, or clubbing.

## 2011-04-21 NOTE — Assessment & Plan Note (Signed)
Blood pressure well-controlled. Continue current regimen. BP Readings from Last 3 Encounters:  04/21/11 120/80  04/07/11 171/102  03/07/11 168/101    

## 2011-04-21 NOTE — Assessment & Plan Note (Signed)
Tdap given today.

## 2011-04-21 NOTE — Assessment & Plan Note (Signed)
I did not make any changes in patient's medication regimen since patient did not bring her meter with her. I recommended to check her blood sugars on a regular basis and bring in the meter during the next office visit for possible changes in management.

## 2011-04-21 NOTE — Patient Instructions (Signed)
Please check your sugars daily before breakfast and bring in the meter during your next office visit. Please  Bring in all your medication with you during next office visit.

## 2011-06-01 ENCOUNTER — Encounter: Payer: Self-pay | Admitting: Internal Medicine

## 2011-06-27 ENCOUNTER — Ambulatory Visit: Payer: Self-pay

## 2011-06-28 ENCOUNTER — Ambulatory Visit: Payer: Self-pay | Admitting: *Deleted

## 2011-06-28 DIAGNOSIS — Z299 Encounter for prophylactic measures, unspecified: Secondary | ICD-10-CM

## 2011-06-28 MED ORDER — TUBERCULIN PPD 5 UNIT/0.1ML ID SOLN
5.0000 [IU] | Freq: Once | INTRADERMAL | Status: AC
  Administered 2011-06-28: 5 [IU] via INTRADERMAL

## 2011-06-28 NOTE — Progress Notes (Signed)
TB test done 06/28/11 4:15PM mid left forearm - pt aware to be back in clinic 06/30/11 2-4:30PM. Stanton Kidney Torianne Laflam RN 06/28/11 4:30PM

## 2011-06-30 NOTE — Progress Notes (Signed)
Pt returned to clinic to have PPD read.  Results were negative.

## 2011-07-08 ENCOUNTER — Other Ambulatory Visit: Payer: Self-pay | Admitting: *Deleted

## 2011-07-08 DIAGNOSIS — I10 Essential (primary) hypertension: Secondary | ICD-10-CM

## 2011-07-08 MED ORDER — ATENOLOL 100 MG PO TABS: 100.0000 mg | ORAL_TABLET | Freq: Every day | ORAL | Status: DC

## 2011-07-20 ENCOUNTER — Other Ambulatory Visit: Payer: Self-pay | Admitting: Family Medicine

## 2011-07-21 ENCOUNTER — Telehealth: Payer: Self-pay | Admitting: *Deleted

## 2011-07-21 NOTE — Telephone Encounter (Signed)
Fax from [facility]:  Request refill Flexeril 10mg  - Take 1 tab every 12hr as needed w/meals   Last filled 12/09/10  Qty# 60 Also needs clarification on how much Lantus pt is taking - last rx has 35 units; pt states 40 units?? Need new rxs.  Thanks

## 2011-07-22 ENCOUNTER — Other Ambulatory Visit: Payer: Self-pay | Admitting: Internal Medicine

## 2011-07-22 MED ORDER — INSULIN GLARGINE 100 UNIT/ML ~~LOC~~ SOLN: 40.0000 [IU] | Freq: Every day | SUBCUTANEOUS | Status: DC

## 2011-07-22 NOTE — Telephone Encounter (Signed)
[facility] pharmacy made awared of Fleveril denial.

## 2011-07-22 NOTE — Telephone Encounter (Signed)
Refilled lantus 40u per last clinic note with Dr. Loistine Chance.  Will not refill flexiril at this time as I did not see any back pain diagnosis in her problem list/med surg history

## 2011-07-25 ENCOUNTER — Telehealth: Payer: Self-pay | Admitting: *Deleted

## 2011-07-25 ENCOUNTER — Encounter: Payer: Self-pay | Admitting: Internal Medicine

## 2011-07-25 ENCOUNTER — Ambulatory Visit (INDEPENDENT_AMBULATORY_CARE_PROVIDER_SITE_OTHER): Payer: BC Managed Care – PPO | Admitting: Internal Medicine

## 2011-07-25 VITALS — BP 163/93 | HR 71 | Temp 98.1°F | Wt 263.0 lb

## 2011-07-25 DIAGNOSIS — M545 Low back pain, unspecified: Secondary | ICD-10-CM

## 2011-07-25 DIAGNOSIS — N9489 Other specified conditions associated with female genital organs and menstrual cycle: Secondary | ICD-10-CM

## 2011-07-25 DIAGNOSIS — E119 Type 2 diabetes mellitus without complications: Secondary | ICD-10-CM

## 2011-07-25 DIAGNOSIS — I10 Essential (primary) hypertension: Secondary | ICD-10-CM

## 2011-07-25 MED ORDER — FOLIC ACID 1 MG PO TABS: 1.0000 mg | ORAL_TABLET | Freq: Every day | ORAL | Status: DC

## 2011-07-25 MED ORDER — HYDROCHLOROTHIAZIDE 25 MG PO TABS: 25.0000 mg | ORAL_TABLET | Freq: Every day | ORAL | Status: DC

## 2011-07-25 MED ORDER — METAXALONE 800 MG PO TABS: 400.0000 mg | ORAL_TABLET | Freq: Three times a day (TID) | ORAL | Status: AC

## 2011-07-25 MED ORDER — METAXALONE 800 MG PO TABS: 400.0000 mg | ORAL_TABLET | Freq: Three times a day (TID) | ORAL | Status: DC

## 2011-07-25 MED ORDER — ATENOLOL 100 MG PO TABS: 100.0000 mg | ORAL_TABLET | Freq: Every day | ORAL | Status: DC

## 2011-07-25 MED ORDER — INSULIN GLARGINE 100 UNIT/ML ~~LOC~~ SOLN: 40.0000 [IU] | Freq: Every day | SUBCUTANEOUS | Status: DC

## 2011-07-25 NOTE — Telephone Encounter (Signed)
Pt called and left message she needs refill on Flexeril. Has a rash again and pt states fever. Unable to reach pt by phone [phone]. Appt 07/25/11 3:45PM Dr Denton Meek. Ellwood Dense talked with pt about appt time. Stanton Kidney Dizler RN 07/25/11 2:30PM

## 2011-07-25 NOTE — Patient Instructions (Signed)
Please, pick up your prescriptions at the [facility]. Please, do not drive and/or operate machinery if you are feeling somnolent while taking Skelaxin or any other medications. Please, call with any questions and follow up in 3 months with Dr. Milbert Coulter (PCP)

## 2011-07-26 ENCOUNTER — Encounter: Payer: Self-pay | Admitting: Internal Medicine

## 2011-07-26 NOTE — Progress Notes (Signed)
Patient ID: JERALDINE PRIMEAU, female   DOB: 1960/08/18, 51 y.o.   MRN: [id number] HPI:    Patient is here "because would like to refill her prenatal vitamins." patient states that she is planing to conceive. Denies any previous pregnancies; reports regular menstrual cycle. Denies any other concerns. Review of Systems: Negative except per history of present illness  Physical Exam:  Nursing notes and vitals reviewed General:  alert, well-developed, and cooperative to examination.   Lungs:  normal respiratory effort, no accessory muscle use, normal breath sounds, no crackles, and no wheezes. Heart:  normal rate, regular rhythm, no murmurs, no gallop, and no rub.   Abdomen:  soft, non-tender, normal bowel sounds, no distention, no guarding, no rebound tenderness, no hepatomegaly, and no splenomegaly.   Extremities:  No cyanosis, clubbing, edema Neurologic:  alert & oriented X3, nonfocal exam  Meds:  (Not in a hospital admission)  Allergies: Codeine and Morphine Past Medical History  Diagnosis Date  . Hypertension   . Diabetes mellitus   . HLD (hyperlipidemia)   . Ovarian mass 11/2009    per MRI report 11/05/2009 - bilateral fibroadenomas wv Darnelle Bos tumor; patient is following at [facility]  . HYPERLIPIDEMIA 07/30/2009   No past surgical history on file. Family History  Problem Relation Age of Onset  . Stroke Neg Hx   . Miscarriages / Stillbirths Neg Hx   . Heart disease Neg Hx   . Cancer Neg Hx    History   Social History  . Marital Status: Divorced    Spouse Name: N/A    Number of Children: N/A  . Years of Education: N/A   Occupational History  . Not on file.   Social History Main Topics  . Smoking status: Never Smoker   . Smokeless tobacco: Not on file  . Alcohol Use: No  . Drug Use: No  . Sexually Active: Yes   Other Topics Concern  . Not on file   Social History Narrative   Financial assistance approved for 100% discount at [facility] and has [facility] cardDeborah  Hill  February 02, 2010 12:30 PM   A/P: 1. Ms. Garza is a 51 y/o woman who is here for a refill on her pre-natal vitamins due to planning of conception. -patient was referred to OB/GYN  -Rf on MVI given  2. Hx of ovarian mass. - Records reviewed. -Patient is being follwed by Dr. Noland Fordyce at [facility] -request for medical records from [facility]  Completed. -Patient was strongly advised to follow up with Dr. Noland Fordyce as was instructed.

## 2011-08-05 ENCOUNTER — Encounter: Payer: Self-pay | Admitting: Internal Medicine

## 2011-08-23 ENCOUNTER — Emergency Department (HOSPITAL_COMMUNITY)
Admission: EM | Admit: 2011-08-23 | Discharge: 2011-08-23 | Discharge status: Absent without leave | Payer: BC Managed Care – PPO | Source: Home / Self Care | Attending: Emergency Medicine | Admitting: Emergency Medicine

## 2011-08-23 ENCOUNTER — Telehealth: Payer: Self-pay | Admitting: *Deleted

## 2011-08-23 NOTE — Telephone Encounter (Signed)
Would advise her to go to [facility] today.

## 2011-08-23 NOTE — Telephone Encounter (Signed)
Called pt back and advised [facility] today.  She voices understanding and will go.

## 2011-08-23 NOTE — Telephone Encounter (Signed)
Pt called asking for an appointment today, called after 3:00 wanting to be seen. C/o headache, and  rash around neck.  Also states she has a tampon stuck inside her and she is unable to remove as the string is gone.    I don't think this should wait until tomorrow.  She I send to [facility]?

## 2011-08-24 ENCOUNTER — Ambulatory Visit (INDEPENDENT_AMBULATORY_CARE_PROVIDER_SITE_OTHER): Payer: BC Managed Care – PPO | Admitting: Ophthalmology

## 2011-08-24 ENCOUNTER — Telehealth: Payer: Self-pay | Admitting: *Deleted

## 2011-08-24 ENCOUNTER — Encounter: Payer: Self-pay | Admitting: Ophthalmology

## 2011-08-24 VITALS — BP 185/109 | HR 86 | Temp 98.3°F | Ht 65.5 in | Wt 253.1 lb

## 2011-08-24 DIAGNOSIS — N979 Female infertility, unspecified: Secondary | ICD-10-CM

## 2011-08-24 DIAGNOSIS — E119 Type 2 diabetes mellitus without complications: Secondary | ICD-10-CM

## 2011-08-24 DIAGNOSIS — Z79899 Other long term (current) drug therapy: Secondary | ICD-10-CM

## 2011-08-24 DIAGNOSIS — T192XXA Foreign body in vulva and vagina, initial encounter: Secondary | ICD-10-CM

## 2011-08-24 DIAGNOSIS — X58XXXA Exposure to other specified factors, initial encounter: Secondary | ICD-10-CM

## 2011-08-24 DIAGNOSIS — E1165 Type 2 diabetes mellitus with hyperglycemia: Secondary | ICD-10-CM

## 2011-08-24 DIAGNOSIS — I10 Essential (primary) hypertension: Secondary | ICD-10-CM

## 2011-08-24 LAB — LIPID PANEL
Cholesterol: 233 mg/dL — ABNORMAL HIGH (ref 0–200)
HDL: 44 mg/dL (ref >39)
Total CHOL/HDL Ratio: 5.3 Ratio
VLDL: 40 mg/dL (ref 0–40)

## 2011-08-24 LAB — POCT GLYCOSYLATED HEMOGLOBIN (HGB A1C): Hemoglobin A1C: 7.9

## 2011-08-24 MED ORDER — PRENATAL MULTIVIT-MIN-FE-FA 0.1 MG PO TABS: 1.0000 | ORAL_TABLET | Freq: Every day | ORAL | Status: DC

## 2011-08-24 NOTE — Assessment & Plan Note (Addendum)
Patient has been trying to get pregnant since before 2012, will refer to gynecology. Likely due to obesity. Would benefit from clomid, she is of advanced age and would benefit from counseling as well.

## 2011-08-24 NOTE — Assessment & Plan Note (Signed)
Removed tampon with ringed forceps on speculum exam.

## 2011-08-24 NOTE — Assessment & Plan Note (Signed)
A1c is improved at 7.9 from 8.7 previously. Patient hasn't been taking lantus for past 3 weeks so likely is even better. She was offered 70/30 BID which would be cheaper but she prefers lantus. Advised to start exercising to go with dietary changes.

## 2011-08-24 NOTE — Progress Notes (Signed)
Subjective:   Patient ID: Brenda Bowman female   DOB: 09/08/1960 51 y.o.   MRN: [id number]  HPI: Brenda Bowman is a 51 y.o. woman here for acute visit for reatined tampon.  Shoulder blade pain- it is improving  Retained tampon- since Monday evening  Rash on neck- itchy, not painful, since Monday  HTN- BP is elevated, stopped meds because she was worried that it could be contributed to itch. Last took mother's day weekend, medication was left in there truck.  DM- is paying $80 for lantus co-pay. Has been on lantus since the summer. Will call with A1c.  Obesity- Has lost 10 lbs in the last month. Is making an effort to eat healthy snacks & has stopped drinking all soda. Is going to try to make an effort to get on the treadmill. Is facing right towards the TV. Enjoys being on the treadmill.  Ovarian mass being followed by Dr. Noland Fordyce at [facility]- she reports it was not growing with last MRI.  Hyperlipidemia- untreated since she is trying to conceive.   Infertility- since end of 2011- will refer to gynecology- had bad experience at [facility] but she is not against going back to women's clinic  Past Medical History  Diagnosis Date  . Hypertension   . Diabetes mellitus   . HLD (hyperlipidemia)   . 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 HYPERLIPIDEMIA 07/30/2009   Current Outpatient Prescriptions  Medication Sig Dispense Refill  . atenolol (TENORMIN) 100 MG tablet Take 1 tablet (100 mg total) by mouth daily.  30 tablet  11  . folic acid (FOLVITE) 1 MG tablet Take 1 tablet (1 mg total) by mouth daily.  90 tablet  2  . glucose blood (TRUETRACK TEST) test strip Use to test blood sugar twice a day (Before breakfast and dinner.)       . hydrochlorothiazide (HYDRODIURIL) 25 MG tablet Take 25 mg by mouth daily.      . insulin glargine (LANTUS SOLOSTAR) 100 UNIT/ML injection Inject  40 Units into the skin daily.  15 mL  5  . Insulin Pen Needle (SURE COMFORT PEN NEEDLES) 31G X 5 MM MISC Use to inject insulin once a day.       . Lancets 30G MISC Use to test blood sugar 2 times a day.       . Prenatal MV-Min-Fe Cbn-FA-DHA (PRENATAL PLUS DHA) 7-0.4-100 MG TABS 1 tablet by Per post-pyloric tube route daily.  30 each  4   Family History  Problem Relation Age of Onset  . Stroke Neg Hx   . Miscarriages / Stillbirths Neg Hx   . Heart disease Neg Hx   . Cancer Neg Hx    History   Social History  . Marital Status: Divorced    Spouse Name: N/A    Number of Children: N/A  . Years of Education: N/A   Social History Main Topics  . Smoking status: Never Smoker   . Smokeless tobacco: None  . Alcohol Use: No  . Drug Use: No  . Sexually Active: Yes   Other Topics Concern  . None   Social History Narrative   Financial assistance approved for 100% discount at [facility] and [facility]  February 02, 2010 12:30 PM    Objective:  Physical Exam: Filed Vitals:   08/24/11 0939  BP: 185/109  Pulse: 86  Temp: 98.3 F (36.8 C)  TempSrc: Oral  Height: 5' 5.5" (1.664 m)  Weight: 253 lb 1.6 oz (114.805 kg)  SpO2: 98%   General: middle aged woman sitting in chair HEENT: PERRL, EOMI, no scleral icterus Ext: warm and well perfused, no pedal edema Neuro: alert and oriented X3, cranial nerves II-XII grossly intact Pelvic: there is blood stained tampon present in vagina, no external skin lesions or discharge  Assessment & Plan:

## 2011-08-24 NOTE — Telephone Encounter (Signed)
Pt arrives in clinic this AM and states Urgent Care would not see her. Has BC/BS - C. Lissa Hoard talked  with Urgent Care and pt -  why she was not seen. Has no GYN doctor - has had tampon in vagina x 2 days - string broke. Now is c/o upper right back pain. Appt given with Dr Maisie Fus 08/24/11 9:45AM. Stanton Kidney Nanci Lakatos RN 08/24/11 9AM

## 2011-08-24 NOTE — Assessment & Plan Note (Signed)
Patient is eating better and has lost 10 lbs in past month. She was congratulated on this and she is motivated to start walking on treadmill at home.

## 2011-08-24 NOTE — Patient Instructions (Addendum)
-  Try to make an effort to get into a habit of watching your favorite TV show and doing treadmill together! -Keep up the great work! -Put some talcum powder on your neck to help with the rash

## 2011-08-24 NOTE — Assessment & Plan Note (Signed)
Hasn't been taking meds. No changes made.

## 2011-08-31 ENCOUNTER — Encounter: Payer: Self-pay | Admitting: Ophthalmology

## 2011-08-31 ENCOUNTER — Telehealth: Payer: Self-pay | Admitting: Ophthalmology

## 2011-08-31 DIAGNOSIS — E785 Hyperlipidemia, unspecified: Secondary | ICD-10-CM | POA: Insufficient documentation

## 2011-08-31 MED ORDER — PRAVASTATIN SODIUM 40 MG PO TABS: 40.0000 mg | ORAL_TABLET | Freq: Every day | ORAL | Status: DC

## 2011-08-31 NOTE — Telephone Encounter (Signed)
Cholesterol was elevated on lab results so I called patient to let her know that I would send a cholesterol medication to the pharmacy that she should start taking each evening.

## 2011-09-26 NOTE — Addendum Note (Signed)
Addended by: Neomia Dear on: 09/26/2011 05:09 PM   Modules accepted: Orders

## 2011-10-11 ENCOUNTER — Telehealth: Payer: Self-pay | Admitting: *Deleted

## 2011-10-11 NOTE — Telephone Encounter (Signed)
Fax from [place]  - requesting refill on Flexeril 10mg  - Take 1 tab by mouth every 12hrs as needed with meals Last refilled 12/09/10 Qty# 60.

## 2011-10-12 NOTE — Telephone Encounter (Signed)
[facility] pharmacy made awared of denial.

## 2011-10-12 NOTE — Telephone Encounter (Signed)
This med was from 2012. Last note indicates she is trying to get pregnant so should not be on a lot of meds. Refused.

## 2011-10-19 ENCOUNTER — Telehealth: Payer: Self-pay | Admitting: *Deleted

## 2011-10-19 NOTE — Telephone Encounter (Signed)
Pt calling about refill on Flexeril. See telephone note 10/11/11 - reason for denial explained to pt. Pt acceptable.

## 2011-11-02 ENCOUNTER — Encounter: Payer: BC Managed Care – PPO | Admitting: Internal Medicine

## 2011-11-04 ENCOUNTER — Encounter: Payer: Self-pay | Admitting: Internal Medicine

## 2011-12-19 ENCOUNTER — Encounter: Payer: Self-pay | Admitting: Internal Medicine

## 2011-12-19 ENCOUNTER — Ambulatory Visit (INDEPENDENT_AMBULATORY_CARE_PROVIDER_SITE_OTHER): Payer: BC Managed Care – PPO | Admitting: Internal Medicine

## 2011-12-19 ENCOUNTER — Encounter: Payer: Self-pay | Admitting: Gastroenterology

## 2011-12-19 VITALS — BP 141/88 | HR 74 | Temp 98.1°F | Ht 66.0 in | Wt 246.2 lb

## 2011-12-19 DIAGNOSIS — E669 Obesity, unspecified: Secondary | ICD-10-CM

## 2011-12-19 DIAGNOSIS — N979 Female infertility, unspecified: Secondary | ICD-10-CM

## 2011-12-19 DIAGNOSIS — Z79899 Other long term (current) drug therapy: Secondary | ICD-10-CM

## 2011-12-19 DIAGNOSIS — I10 Essential (primary) hypertension: Secondary | ICD-10-CM

## 2011-12-19 DIAGNOSIS — E119 Type 2 diabetes mellitus without complications: Secondary | ICD-10-CM

## 2011-12-19 DIAGNOSIS — Z0000 Encounter for general adult medical examination without abnormal findings: Secondary | ICD-10-CM

## 2011-12-19 DIAGNOSIS — N839 Noninflammatory disorder of ovary, fallopian tube and broad ligament, unspecified: Secondary | ICD-10-CM

## 2011-12-19 DIAGNOSIS — E785 Hyperlipidemia, unspecified: Secondary | ICD-10-CM

## 2011-12-19 DIAGNOSIS — R110 Nausea: Secondary | ICD-10-CM

## 2011-12-19 DIAGNOSIS — N9489 Other specified conditions associated with female genital organs and menstrual cycle: Secondary | ICD-10-CM

## 2011-12-19 DIAGNOSIS — Z23 Encounter for immunization: Secondary | ICD-10-CM

## 2011-12-19 DIAGNOSIS — R109 Unspecified abdominal pain: Secondary | ICD-10-CM

## 2011-12-19 DIAGNOSIS — Z3490 Encounter for supervision of normal pregnancy, unspecified, unspecified trimester: Secondary | ICD-10-CM

## 2011-12-19 LAB — POCT GLYCOSYLATED HEMOGLOBIN (HGB A1C): Hemoglobin A1C: 8.2

## 2011-12-19 LAB — GLUCOSE, CAPILLARY: Glucose-Capillary: 157 mg/dL — ABNORMAL HIGH (ref 70–99)

## 2011-12-19 MED ORDER — INSULIN GLARGINE 100 UNIT/ML ~~LOC~~ SOLN: 30.0000 [IU] | Freq: Every day | SUBCUTANEOUS | Status: DC

## 2011-12-19 MED ORDER — FREESTYLE SYSTEM KIT: 1.0000 | PACK | Status: DC | PRN

## 2011-12-19 MED ORDER — HYDROCHLOROTHIAZIDE 25 MG PO TABS: 25.0000 mg | ORAL_TABLET | Freq: Every day | ORAL | Status: DC

## 2011-12-19 MED ORDER — LABETALOL HCL 100 MG PO TABS: 100.0000 mg | ORAL_TABLET | Freq: Two times a day (BID) | ORAL | Status: DC

## 2011-12-19 NOTE — Assessment & Plan Note (Addendum)
A1c 8.2 fingerstick 189 in clinic today, A1c increased from last visit of 7.9 in May 2013.  Is supposed to be on lantus but was unfamiliar with medication.  Claims to check sugars with occasional high's especially after having milkshakes daily lately.  Denies any lows.  Claims to be averaging 112s, although I do not think that is very accurate considering the increase in A1C, no medication, no diet control, and sugars of 189 in clinic today.  Counseled extensively on diabetic education, diet control, medication adherence, regular checking of blood sugars, and exercise, especially in the setting of trying to become pregnant when high risk.    -Recommend her to see Norm Parcel for more education and close follow up with first available appointment, discuss meal time coverage and proper equipment for glucose monitoring -check blood sugars at least three times a day as discussed, record findings, and bring to f/u appointments with pcp and Lupita Leash -continue to monitor weight, follow diabetic diet, and exercise -f/u GYN for high risk pregnancy evaluation as she is trying to get pregnant -Started on Lantus 30 units daily -foot exam done today -A1c done today 8.2, increased from May 2013 -referred for annual eye exam today

## 2011-12-19 NOTE — Assessment & Plan Note (Addendum)
x2 weeks, worse with food intake.  Trying to get pregnant. Period is currently late, was supposed to be on September 12th, 2013, usually regular.  Urine pregnancy test in clinic today negative.  Does not wish to take any medication at this time.  -continue to monitor -continue blood glucose monitoring and diet -if worsens or no improvement call or come to clinic or if severe go to ED -f/u gyn for high risk preg evaluation -f/u with pcp in one month

## 2011-12-19 NOTE — Assessment & Plan Note (Addendum)
Weight 246lbs today down from 253lb in May 2013=7 pound weight loss.  -continue to follow diabetic diet and diet modification -continue to monitor

## 2011-12-19 NOTE — Assessment & Plan Note (Addendum)
Pregnant once in 1999, miscarried early.  Has been trying since then. Period late this vist, urine pregnancy test in clinic negative.  -referred to GYN for high risk evaluation and counseling again, previous appointment no show in system -counseled extensively on risks and medications -discontinued high risk medications -f/u pcp in one month -f/u gyn -hx of ovarian mass, f/u Dr. Noland Fordyce at [facility] as scheduled -continue folic acid and prenatal vitamins

## 2011-12-19 NOTE — Assessment & Plan Note (Signed)
BP in clinic today 156/94.  On Atenolol 100mg  QD and HCTZ 25mg  po QD.  She is trying to get pregnant thus is advised to discontinue atenolol at this time as it is classified as Category D.  HCTZ is category B and discussed risks--will continue for now.  Started on Labetalol  -d/c atenolol -start Labetalol 100mg  PO BID  -continue HCTZ 25MG  PO QD -continue to monitor -f/u with GYN for high risk pregnancy -f/u with PCP in one month

## 2011-12-19 NOTE — Assessment & Plan Note (Addendum)
F/u with Dr. Noland Fordyce at [facility] F/u with gyn for high risk patient wanting to get pregnant--referral made today

## 2011-12-19 NOTE — Assessment & Plan Note (Addendum)
Last LDL 149 in May 2013, supposed to be on pravastatin but claims to not be taking the medication. Was not aware of medication.  -d/c pravastatin as category x in pregnancy -counseled on diet modification -continue to monitor -f/u pcp in one month

## 2011-12-19 NOTE — Progress Notes (Signed)
Subjective:   Patient ID: Brenda Bowman female   DOB: 02-Jul-1960 50 y.o.   MRN: [id number]  HPI: Brenda Bowman is a 51 y.o. African American obese female with PMH of uncontrolled DM and HTN, ovarian mass, and hyperlipidemia presenting to clinic today for complaints of intermittent nausea x2 weeks, worse with food intake.  She denies any vomiting, but does claims to have one episode of dizziness/light-headedness this afternoon after eating when she had nausea and minor headache.  The headache and dizziness have since then resolved but nausea remains.  She denies previous episodes of nausea or any other GI disturbances.  She is non-adherent with her diabetic medications and does not control her diabetes.  Her A1c today was 8.2 on no insulin therapy.  I discussed with her in detail in regards to diabetic education, need for her to see Norm Parcel for further education, monitoring blood glucose levels regularly and noting results and bringing them back for f/u with PCP, diabetic diet control, weight loss, and exercise along with adhering to diabetic medication.  She has no other major complaints at this time.  She denies any fever, chest pain, shortness of breath, abdominal pain, diarrhea, hematuria, or any urinary complaints at this time.    Of note, Brenda Bowman is also trying to get pregnant and was supposed to have her period on September 12th, 2013 but is currently late.  Urine pregnancy in clinic negative.   She has not been evaluated by high risk GYN yet and will be referred during this visit.  She also has a hx of ovarian mass, questionable malignancy and is followed by Dr. Noland Fordyce at [facility].  I discussed in detail with her risks of pregnancy at this age, reviewed medications in detail, referred her to high risk evaluation for GYN given age and gyn hx as well.   Past Medical History  Diagnosis Date  . Hypertension   . Diabetes mellitus   . HLD (hyperlipidemia)   . 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 HYPERLIPIDEMIA 07/30/2009   Current Outpatient Prescriptions  Medication Sig Dispense Refill  . atenolol (TENORMIN) 100 MG tablet Take 1 tablet (100 mg total) by mouth daily.  30 tablet  11  . folic acid (FOLVITE) 1 MG tablet Take 1 tablet (1 mg total) by mouth daily.  90 tablet  2  . glucose blood (TRUETRACK TEST) test strip Use to test blood sugar twice a day (Before breakfast and dinner.)       . hydrochlorothiazide (HYDRODIURIL) 25 MG tablet Take 25 mg by mouth daily.      . Lancets 30G MISC Use to test blood sugar 2 times a day.       . Prenatal Multivit-Min-Fe-FA 0.1 MG TABS Take 1 tablet (0.1 mg total) by mouth daily.  60 tablet  6  . fish oil-omega-3 fatty acids 1000 MG capsule Take 2 g by mouth daily.      . insulin glargine (LANTUS SOLOSTAR) 100 UNIT/ML injection Inject 40 Units into the skin daily.  15 mL  5  . Insulin Pen Needle (SURE COMFORT PEN NEEDLES) 31G X 5 MM MISC Use to inject insulin once a day.       . metaxalone (SKELAXIN) 800 MG tablet Take 400 mg by mouth 3 (three) times daily.      . pravastatin (PRAVACHOL) 40 MG tablet Take 1 tablet (40  mg total) by mouth daily.  90 tablet  3   Family History  Problem Relation Age of Onset  . Stroke Neg Hx   . Miscarriages / Stillbirths Neg Hx   . Heart disease Neg Hx   . Cancer Neg Hx    History   Social History  . Marital Status: Divorced    Spouse Name: N/A    Number of Children: N/A  . Years of Education: N/A   Social History Main Topics  . Smoking status: Never Smoker   . Smokeless tobacco: None  . Alcohol Use: No  . Drug Use: No  . Sexually Active: Yes   Other Topics Concern  . None   Social History Narrative   Financial assistance approved for 100% discount at [facility] and has [facility]orah [facility]  February 02, 2010 12:30 PM   Review of Systems: Constitutional: Denies fever, chills,  diaphoresis, appetite change and fatigue.  HEENT: Denies photophobia, eye pain, redness, hearing loss, ear pain, congestion, sore throat, rhinorrhea, sneezing, mouth sores, trouble swallowing, neck pain, neck stiffness and tinnitus.   Respiratory: Denies SOB, DOE, cough, chest tightness,  and wheezing.   Cardiovascular: Denies chest pain, palpitations and leg swelling.  Gastrointestinal: Nausea. Denies vomiting, abdominal pain, diarrhea, constipation, blood in stool and abdominal distention.  Genitourinary: Hx of ovarian mass.  Denies dysuria, urgency, frequency, hematuria, flank pain and difficulty urinating. Musculoskeletal: Denies myalgias, back pain, joint swelling, arthralgias and gait problem.  Skin: Denies pallor, rash and wound.  Neurological: Dizziness. Denies seizures, syncope, weakness, light-headedness, numbness and headaches.  Hematological: Denies adenopathy. Easy bruising, personal or family bleeding history  Psychiatric/Behavioral: Denies suicidal ideation, mood changes, confusion, nervousness, sleep disturbance and agitation  Objective:  Physical Exam: Filed Vitals:   12/19/11 1459  BP: 156/94  Pulse: 75  Temp: 98.1 F (36.7 C)  TempSrc: Oral  Height: 5\' 6"  (1.676 m)  Weight: 246 lb 3.2 oz (111.676 kg)  SpO2: 96%   Constitutional: Vital signs reviewed.  Patient is a well-developed and obese female in no acute distress and cooperative with exam. Alert and oriented x3.  Head: Normocephalic and atraumatic Ear: TM normal bilaterally Mouth: no erythema or exudates, MMM Eyes: PERRLA, EOMI, conjunctivae normal, No scleral icterus.  Neck: Supple, Trachea midline normal ROM Cardiovascular: RRR, S1 normal, S2 normal, no MRG, pulses symmetric and intact bilaterally Pulmonary/Chest: CTAB, no wheezes, rales, or rhonchi Abdominal: Soft. Obese, Non-tender, non-distended, bowel sounds are normal, no masses, organomegaly, or guarding present.  GU: no CVA tenderness. Hx of ovarian  mass.  Musculoskeletal: No joint deformities, erythema, or stiffness, ROM full and no nontender Hematology: no cervical, inginal, or axillary adenopathy.  Neurological: A&O x3, Strength is normal and symmetric bilaterally, cranial nerve II-XII are grossly intact, no focal motor deficit, sensory intact to light touch bilaterally.  Skin: Warm, dry and intact. No rash, cyanosis, or clubbing.  Psychiatric: Normal mood and affect. speech and behavior is normal. Judgment and thought content normal. Cognition and memory are normal.   Assessment & Plan:  Case Discussed with Dr. Kem Kays  DM: uncontrolled A1C today 8.2, needs to see Lupita Leash for diabetic education ASAP, started on Lantus 30 units QD but will need to be placed on meal coverage after discussion with Lupita Leash.  F/u blood sugars, advised to check three times a day.  HTN: due to trying to get pregnant, discontinued Atenolol as it is Cat D and started on Labetalol 100mg  PO BID  GYN: Trying to get pregnant, hx  of infertility and ovarian mass, high risk, uncontrolled DM and HTN. Referred to high risk gyn for further evaluation.  Urine pregnancy in clinic today negative.  Was supposed to start period Dec 15, 2011.  Health maintenance: flu vaccine and foot exam done today.  Referred for colonoscopy, mammogram, eye exam today as well.

## 2011-12-19 NOTE — Patient Instructions (Addendum)
Please go for mammogram, colonoscopy, and annual eye exam as referred today for regular health maintenance screening  Please f/u with GYN referral for fertility issues AS SOON AS POSSIBLE--you are set for high risk pregnancy and need to be evaluated  Please continue folic acid and prenatal vitamins  Please f/u with Norm Parcel for diabetic education as soon as possible and with PCP Dr. Everardo Beals in one month for follow up visit  Started on Lantus 30 units daily, monitor blood sugar levels three times a day and bring readings to f/u clinic appointment with PCP  Please continue weight loss, exercise, and diabetic diet as discussed today  Flu shot given today and foot exam  HTN: DISCONTINUED atenolol as Category D for pregnancy, switched to LABETALOL 100MG  by mouth twice a day, please take labetalol daily and can continue HCTZ if you so choose (Cat B)--risks discussed  DISCONTINUED PRAVASTATIN Category X for pregnancy  Tylenol for pain, discontinue all other muscle relaxants including Skelaxin that was on medication list  Avoid any medications unsafe for pregnancy, call PCP Dr. Everardo Beals before starting any new medication  You have chosen to not have any medication for nausea, continue to monitor if worsens or continues call or come to clinic or go to ED if severe.

## 2011-12-21 ENCOUNTER — Encounter: Payer: BC Managed Care – PPO | Admitting: Internal Medicine

## 2011-12-21 NOTE — Progress Notes (Signed)
INTERNAL MEDICINE TEACHING ATTENDING ADDENDUM - Brenda Blue, DO: I personally saw and evaluated Brenda Bowman in this clinic visit in conjunction with the resident, Dr. Virgina Organ. I have discussed patient's plan of care with medical resident during this visit. I have confirmed the physical exam findings and have read and agree with the clinic note including the plan.

## 2011-12-26 ENCOUNTER — Telehealth: Payer: Self-pay | Admitting: Dietician

## 2011-12-30 ENCOUNTER — Encounter: Payer: Self-pay | Admitting: Dietician

## 2011-12-30 NOTE — Telephone Encounter (Signed)
Mailed letter  asking patient to call to set up an appointment.

## 2012-01-17 ENCOUNTER — Encounter: Payer: Self-pay | Admitting: Internal Medicine

## 2012-01-17 ENCOUNTER — Other Ambulatory Visit: Payer: Self-pay | Admitting: Internal Medicine

## 2012-01-17 DIAGNOSIS — Z5329 Procedure and treatment not carried out because of patient's decision for other reasons: Secondary | ICD-10-CM | POA: Insufficient documentation

## 2012-01-30 ENCOUNTER — Telehealth: Payer: Self-pay | Admitting: *Deleted

## 2012-01-30 NOTE — Telephone Encounter (Signed)
Pt left a message c/o of pain and cramps in uterus. Some nausea. Pt ? Preg. Appt made 02/01/12 1:45PM Dr Everardo Beals. Stanton Kidney Atlee Villers RN 01/30/12 5PM

## 2012-01-31 ENCOUNTER — Ambulatory Visit (HOSPITAL_COMMUNITY): Payer: BC Managed Care – PPO

## 2012-02-01 ENCOUNTER — Ambulatory Visit: Payer: BC Managed Care – PPO | Admitting: Internal Medicine

## 2012-02-06 ENCOUNTER — Telehealth: Payer: Self-pay | Admitting: *Deleted

## 2012-02-06 NOTE — Telephone Encounter (Signed)
Pt called upset about change in medication.  She is asking to be put back on atenolol. She does not want to take labetalol.  She tried taking it today and had a headache all day.  I scheduled her an appointment with you  On 12/4.  She wants to go back on atenolol until she sees you. Please advise. Pt # [id number]

## 2012-02-07 NOTE — Telephone Encounter (Signed)
Pt called to relay message, no answer.  Message left to call clinic

## 2012-02-07 NOTE — Telephone Encounter (Signed)
Pt informed and she states she does not want to take labetalol.  It is not working, she has tried it for 2 days.  She would like to try something else other than labetalol.

## 2012-02-07 NOTE — Telephone Encounter (Signed)
I've still never seen this patient, but it seems she was switched to labetalol because atenlol is Category D for pregnancy and she is trying to get pregnant.  She should not take atenolol if she is trying to get pregnant.

## 2012-02-08 ENCOUNTER — Ambulatory Visit (INDEPENDENT_AMBULATORY_CARE_PROVIDER_SITE_OTHER): Payer: BC Managed Care – PPO | Admitting: Internal Medicine

## 2012-02-08 ENCOUNTER — Encounter: Payer: Self-pay | Admitting: Internal Medicine

## 2012-02-08 VITALS — BP 150/98 | HR 86 | Temp 99.3°F | Ht 66.0 in | Wt 242.5 lb

## 2012-02-08 DIAGNOSIS — I10 Essential (primary) hypertension: Secondary | ICD-10-CM

## 2012-02-08 DIAGNOSIS — Z0000 Encounter for general adult medical examination without abnormal findings: Secondary | ICD-10-CM

## 2012-02-08 DIAGNOSIS — N979 Female infertility, unspecified: Secondary | ICD-10-CM

## 2012-02-08 MED ORDER — HYDRALAZINE HCL 25 MG PO TABS: 25.0000 mg | ORAL_TABLET | Freq: Two times a day (BID) | ORAL | Status: DC

## 2012-02-08 NOTE — Telephone Encounter (Signed)
i have spoken to pt, she wants to be seen today to discuss BP med and adverse side effects and pregnancy. appt per [place]. At 1415 dr wallace

## 2012-02-08 NOTE — Progress Notes (Signed)
  Subjective:    Patient ID: Brenda Bowman, female    DOB: 06/06/1960, 51 y.o.   MRN: [id number]  CC: does not tolerate labetalol  HPI:  This is a 51 year old woman who is obese and has hypertension and diabetes.  She is actively trying to become pregnant.  At her last visit in September, her intentions regarding pregnancy were noted and a review of her medications revealed atenolol which is pregnancy class D.  This was stopped and she was started on labetalol.  The day after starting labetalol, she started experiencing headaches and some itching on her arms which persists today.  She does not want to take labetalol and wants to switch back to atenolol.  Additionally, she was upset that so many appointments were made for her and she just simply does not have the time to attend them since she is working now.  I went through each of the appointments (high risk obstetrics, opthalmology, screening colonoscopy, and screening mammography), and I explained the importance of each.  I also empathized with her, acknowledging her time constraints and reassuring her that if she needs to postpone certain things, there are risks but that we understand and will work with her.    Review of Systems  All other systems reviewed and are negative.       Objective:   Physical Exam GENERAL: obese; no acute distress LUNGS: clear to auscultation bilaterally, normal work of breathing HEART: normal rate and regular rhythm; normal S1 and S2 without S3 or S4; no murmurs, rubs, or clicks SKIN: area on arms that is pruritic appear normal; without erythema, rash, or excoriation   Filed Vitals:   02/08/12 1455  BP: 150/98  Pulse: 86  Temp: 99.3 F (37.4 C)    CBG (last 3)   Basename 02/08/12 1502  GLUCAP 126*             Assessment & Plan:

## 2012-02-08 NOTE — Patient Instructions (Signed)
Stop taking labetalol.  Start taking hydralazine 25mg  twice a day.  Come back in 1 month to see how these changes are going.  Schedule an appointment with high risk OB in January.

## 2012-02-08 NOTE — Assessment & Plan Note (Addendum)
Blood pressure remains uncontrolled.  She takes hydrochlorothiazide 25mg  daily and was recently switched from atenolol to labetalol 100mg  BID.  Atenolol is a pregnancy class D medication and labetalol and hydrochlorothiazide are both class C.  She refuses to continue labetalol.  We will switch to hydralazine 25mg  BID.  Recognizing the risk of reflexive tachycardia, we will have her return to the clinic in 1 month to follow-up on this.  It has been over a year since renal function and serum electrolytes were evaluated, so we will repeat a BMET today. - continue hydrochlorothiazide 25mg  daily - start hydralazine 25mg  BID - stop labetalol - BMET today - follow up in 1 month, monitoring for BP response and reflex tachycardia  ADDENDUM: BMET normal 02/08/12

## 2012-02-08 NOTE — Assessment & Plan Note (Signed)
Reviewed with the patient the various health maintenance issues that she is due for and the importance of these on disease screening.  She cannot take time from work of any more specialty referrals or procedures this year.  She is due for an annual eye exam with opthalmology, a screening colonoscopy with gastroenterology, and a screening mammogram.  These will need to be scheduled sometime next year. - screening mammogram due but deferred till next year by patient - screening colonoscopy due but deferred till next year by patient - annual eye exam due but deferred till next year by patient

## 2012-02-08 NOTE — Assessment & Plan Note (Addendum)
I reviewed the risks of pregnancy in someone with her co-morbidities, but I also reinforced that the decision to become pregnant is entirely her's and that we are here to support her and keep her healthy in all ways possible.  We are not, however, equipped to manage her medical conditions without the assistance of obstetrics, specifically high risk obstetrics.  Her last period was in September.  We will check a urine pregnancy test today.  She says that she cannot attend any more medical appointments until next year, so we will instruct her to make an appointment with high risk obstetrics in January.  Hopefully they can provide some guidance on the best way to manage her hypertension and diabetes. - urine pregnancy test today - high risk obstetrics in January  ADDENDUM: pregnancy test negative 02/08/2012

## 2012-02-09 ENCOUNTER — Encounter: Payer: Self-pay | Admitting: Internal Medicine

## 2012-02-09 LAB — BASIC METABOLIC PANEL
BUN: 12 mg/dL (ref 6–23)
CO2: 25 mEq/L (ref 19–32)
Calcium: 9.7 mg/dL (ref 8.4–10.5)
Creat: 0.92 mg/dL (ref 0.50–1.10)
Glucose, Bld: 94 mg/dL (ref 70–99)

## 2012-02-09 NOTE — Progress Notes (Signed)
Patient ID: Brenda Bowman, female   DOB: 1960-06-19, 51 y.o.   MRN: [id number]  This patient is a CHRONIC NO-SHOW PATIENT that has a history of HYPERTENSION.  Please make sure to address hypertension during her next clinic appointment, and intervene as appropriate.    Within the AVS, please incorporate the following smartphrase: .htntips   Pertinent Data: BP Readings from Last 3 Encounters:  02/08/12 150/98  12/19/11 141/88  08/24/11 185/109    BMI: Estimated Body mass index is 39.14 kg/(m^2) as calculated from the following:   Height as of 02/08/12: 5\' 6" (1.676 m).   Weight as of 02/08/12: 242 lb 8 oz(109.997 kg).  Smoking History: History  Smoking status  . Never Smoker   Smokeless tobacco  . Not on file    Last Basic Metabolic Panel:    Component Value Date/Time   [name] 140 02/08/2012 1605   K 4.3 02/08/2012 1605   CL 102 02/08/2012 1605   CO2 25 02/08/2012 1605   BUN 12 02/08/2012 1605   CREATININE 0.92 02/08/2012 1605   CREATININE 0.79 10/29/2009 2048   GLUCOSE 94 02/08/2012 1605   CALCIUM 9.7 02/08/2012 1605    Allergies: Allergies  Allergen Reactions  . Codeine   . Morphine

## 2012-02-13 NOTE — Progress Notes (Signed)
agree

## 2012-02-15 ENCOUNTER — Telehealth: Payer: Self-pay | Admitting: *Deleted

## 2012-02-15 NOTE — Addendum Note (Signed)
Addended by: Neomia Dear on: 02/15/2012 05:37 PM   Modules accepted: Orders

## 2012-02-15 NOTE — Telephone Encounter (Signed)
Call from pt. Requesting test results - BMP and Pregnancy test. Results given- nl/negative. Hope this was ok Dr Earlene Plater.

## 2012-02-27 ENCOUNTER — Encounter: Payer: BC Managed Care – PPO | Admitting: Gastroenterology

## 2012-03-07 ENCOUNTER — Encounter: Payer: BC Managed Care – PPO | Admitting: Internal Medicine

## 2012-03-13 ENCOUNTER — Encounter: Payer: BC Managed Care – PPO | Admitting: Internal Medicine

## 2012-06-11 ENCOUNTER — Ambulatory Visit (INDEPENDENT_AMBULATORY_CARE_PROVIDER_SITE_OTHER): Payer: BC Managed Care – PPO | Admitting: Internal Medicine

## 2012-06-11 ENCOUNTER — Encounter: Payer: Self-pay | Admitting: Internal Medicine

## 2012-06-11 VITALS — BP 151/105 | HR 67 | Temp 99.2°F | Ht 66.0 in | Wt 248.2 lb

## 2012-06-11 DIAGNOSIS — Z79899 Other long term (current) drug therapy: Secondary | ICD-10-CM

## 2012-06-11 DIAGNOSIS — I10 Essential (primary) hypertension: Secondary | ICD-10-CM

## 2012-06-11 LAB — POCT GLYCOSYLATED HEMOGLOBIN (HGB A1C): Hemoglobin A1C: 8.6

## 2012-06-11 MED ORDER — BROMPHENIRAMINE MALEATE ER 11 MG PO TB12: 11.0000 mg | ORAL_TABLET | Freq: Two times a day (BID) | ORAL | Status: DC | PRN

## 2012-06-11 MED ORDER — DICLOFENAC SODIUM 1 % TD GEL: 2.0000 g | Freq: Two times a day (BID) | TRANSDERMAL | Status: DC | PRN

## 2012-06-11 MED ORDER — ATENOLOL 50 MG PO TABS: 100.0000 mg | ORAL_TABLET | Freq: Every day | ORAL | Status: DC

## 2012-06-11 MED ORDER — METFORMIN HCL 500 MG PO TABS: 500.0000 mg | ORAL_TABLET | Freq: Two times a day (BID) | ORAL | Status: DC

## 2012-06-11 MED ORDER — LISINOPRIL-HYDROCHLOROTHIAZIDE 20-25 MG PO TABS: 1.0000 | ORAL_TABLET | Freq: Every day | ORAL | Status: DC

## 2012-06-11 MED ORDER — HYDROCHLOROTHIAZIDE 25 MG PO TABS: 25.0000 mg | ORAL_TABLET | Freq: Every day | ORAL | Status: DC

## 2012-06-11 NOTE — Assessment & Plan Note (Signed)
Given hemoccult stool cards and reminded of mammogram. Got check for HIV and gonorrhea/chlamydia at patient request for recent sexual activity.

## 2012-06-11 NOTE — Patient Instructions (Addendum)
We want you to stop taking HCTZ and start taking a new medicine called HCTZ/lisinopril. This is a combination pill that you take once daily for your blood pressure that will also help to protect your kidneys.   We will give you some cards to take home to check for colon cancer that you will have to send back in the mail. Wipe a little bit of stool from tissue onto the cards from 3 different days and return.  We will give you an antihistamine called bromax that you can take up to 2 times a day for congestion. It may make you slightly drowsy and you should take it at night time the first time to ensure that you will not get sleepy.   You are overdue for your mammogram and this needs to be done.  Your diabetes is not well controlled and we need to add another medicine called metformin. Take 1 pill in the morning for 1 week. Then take 1 pill in the morning and 1 pill with dinner after that until you come back in 1 month. Continue taking lantus 20 units at night time.   Come back to the clinic in one month to check on your blood pressure and it is very important as labs need to be done to check on the new medicine.   Our number is [phone] and call with any questions or problems.   Hypertension Information As your heart beats, it forces blood through your arteries. This force is your blood pressure. If the pressure is too high, it is called hypertension (HTN) or high blood pressure. HTN is dangerous because you may have it and not know it. High blood pressure may mean that your heart has to work harder to pump blood. Your arteries may be narrow or stiff. The extra work puts you at risk for heart disease, stroke, and other problems.  Blood pressure consists of two numbers, a higher number over a lower, 110/72, for example. It is stated as "110 over 72." The ideal is below 120 for the top number (systolic) and under 80 for the bottom (diastolic).  You should pay close attention to your blood pressure  if you have certain conditions such as:  Heart failure.   Prior heart attack.   Diabetes   Chronic kidney disease.   Prior stroke.   Multiple risk factors for heart disease.  To see if you have HTN, your blood pressure should be measured while you are seated with your arm held at the level of the heart. It should be measured at least twice. A one-time elevated blood pressure reading (especially in the Emergency Department) does not mean that you need treatment. There may be conditions in which the blood pressure is different between your right and left arms. It is important to see your caregiver soon for a recheck. Most people have essential hypertension which means that there is not a specific cause. This type of high blood pressure may be lowered by changing lifestyle factors such as:  Stress.   Smoking.   Lack of exercise.   Excessive weight.   Drug/tobacco/alcohol use.   Eating less salt.  Most people do not have symptoms from high blood pressure until it has caused damage to the body. Effective treatment can often prevent, delay or reduce that damage. TREATMENT  Treatment for high blood pressure, when a cause has been identified, is directed at the cause. There are a large number of medications to treat HTN. These fall  into several categories, and your caregiver will help you select the medicines that are best for you. Medications may have side effects. You should review side effects with your caregiver. If your blood pressure stays high after you have made lifestyle changes or started on medicines,   Your medication(s) may need to be changed.   Other problems may need to be addressed.   Be certain you understand your prescriptions, and know how and when to take your medicine.   Be sure to follow up with your caregiver within the time frame advised (usually within two weeks) to have your blood pressure rechecked and to review your medications.   If you are taking more  than one medicine to lower your blood pressure, make sure you know how and at what times they should be taken. Taking two medicines at the same time can result in blood pressure that is too low.  Document Released: 05/24/2005 Document Revised: 12/01/2010 Document Reviewed: 05/31/2007 [facility] Patient Information 2012 [place], [place].

## 2012-06-11 NOTE — Assessment & Plan Note (Addendum)
BP Readings from Last 3 Encounters:  06/11/12 151/105  02/08/12 150/98  12/19/11 141/88    Lab Results  Component Value Date   NA 140 02/08/2012   K 4.3 02/08/2012   CREATININE 0.92 02/08/2012    Assessment:  Blood pressure control: mildly elevated  Progress toward BP goal:  unchanged  Comments: initial BP 190/100 and rechecked for value of 151/105.  Plan:  Medications:  Patient was reported to be taking hydralazine in the computer however had bottles for atenol 100 daily and HCTZ 25 mg with her. After recheck advised to take atenolol and HCTZ/lisiopril 25/20 however she adamantly refused even after extensive (>10 minutes) counseling so refill sent for atenolol and HCTZ. BP may be artifically elevated at today's visit given recent OTC cough medications.  Educational resources provided: brochure  Self management tools provided: instructions for home blood pressure monitoring  Other plans: May need to escalate therapy at next visit if the patient is amenable to that

## 2012-06-11 NOTE — Assessment & Plan Note (Signed)
Lab Results  Component Value Date   HGBA1C 8.6 06/11/2012   HGBA1C 8.2 12/19/2011   HGBA1C 7.9 08/24/2011     Assessment:  Diabetes control: fair control  Progress toward A1C goal:  deteriorated  Comments: patient was not taking lantus portion of the time due to cost  Plan:  Medications:  Advised to continue lantus 20 units nightly and to start metformin 500 mg BID however patient was insistent that she could not start metformin. After extensive counseling (>10 minutes) she agreed to taking 250 mg metformin daily for 1 week then 250 mg BID for 1 week then 500 mg and 250 mg for 1 week then 500 mg BID until next visit.   Home glucose monitoring:   Frequency: 2 times a day   Timing: before breakfast;before dinner  Instruction/counseling given: reminded to bring blood glucose meter & log to each visit, reminded to bring medications to each visit, discussed the need for weight loss and provided printed educational material  Educational resources provided: brochure  Self management tools provided:    Other plans: This patient intends to go back to the gym and lose weight. She was strongly counseled to do this and to take the additional medication however she has already had spotty compliance with lantus due to cost. May need to be re-evaluated at next visit. HgA1c checked at today's visit and microalbumin:creatinine ratio drawn.

## 2012-06-11 NOTE — Assessment & Plan Note (Signed)
Spent approximately 5 minutes in consultation with her about exercise and losing weight. She did take a brochure about healthy eating with her.

## 2012-06-11 NOTE — Progress Notes (Signed)
Subjective:     Patient ID: Brenda Bowman, female   DOB: 1960-08-16, 52 y.o.   MRN: [id number]  HPI The patient is a 52 YO female who past PMH of DM II which is poorly controlled, HTN, obesity. She presents for acute visit for her sinuses today. She states that in the past she was treated with antibiotics which worked for her. She is having some drainage with cough that started about 5 days ago. When pressed she states that it does seem to be improving a reasonable amount. She has been taking over the counter medications from [facility] that are 88 cents each. She does not know exactly which ones these are however has also been using multiple lozenges. She is not having ear pain or tinnitus and is having a mild headache which is not unusual. She is not having any pressure in her face. She states she was having some fevers which have alleviated but she never took her temperature. She is coughing which is occasionally productive of a mostly clear sputum. She has been taking her medications as prescribed although sometimes she does not get her lantus as it is $80 per month. She states that back in November at [facility] she had surgery and was also needing hormones that were expensive and she could not afford both. She has not noticed any low sugars at home and has not had any SOB or chest pain.   Review of Systems  Constitutional: Positive for fever. Negative for chills, diaphoresis, activity change, appetite change, fatigue and unexpected weight change.  HENT: Positive for congestion, rhinorrhea and postnasal drip. Negative for hearing loss, ear pain, nosebleeds, sore throat, facial swelling, sneezing, drooling, mouth sores, trouble swallowing, neck pain, neck stiffness, dental problem, voice change, sinus pressure, tinnitus and ear discharge.   Eyes: Negative.   Respiratory: Positive for cough. Negative for apnea, choking, chest tightness, shortness of breath, wheezing and stridor.   Cardiovascular:  Negative for chest pain, palpitations and leg swelling.  Gastrointestinal: Negative for nausea, vomiting, abdominal pain, diarrhea and constipation.  Endocrine: Negative.   Genitourinary: Negative.   Musculoskeletal: Negative.   Skin: Negative.   Neurological: Positive for headaches. Negative for dizziness, tremors, seizures, syncope, facial asymmetry, speech difficulty, weakness, light-headedness and numbness.  Hematological: Negative.        Objective:   Physical Exam  Constitutional: She is oriented to person, place, and time. She appears well-developed and well-nourished.  Obese  HENT:  Head: Normocephalic and atraumatic.  Right Ear: External ear normal.  Left Ear: External ear normal.  Mouth/Throat: Oropharynx is clear and moist.  Some redness of the nasal turbinates  Eyes: EOM are normal. Pupils are equal, round, and reactive to light. Right eye exhibits no discharge. Left eye exhibits no discharge.  Neck: Normal range of motion. Neck supple. No JVD present. No tracheal deviation present. No thyromegaly present.  Cardiovascular: Normal rate and normal heart sounds.   No murmur heard. Pulmonary/Chest: Effort normal and breath sounds normal. No respiratory distress. She has no wheezes. She has no rales. She exhibits no tenderness.  Abdominal: Soft. Bowel sounds are normal. She exhibits no distension. There is no tenderness. There is no rebound.  Musculoskeletal: Normal range of motion. She exhibits no edema and no tenderness.  Neurological: She is alert and oriented to person, place, and time. No cranial nerve deficit.  Skin: Skin is warm and dry.       Assessment/Plan:   1. Please see problem oriented charting.  2. Acute sinus drainage - Likely do not need antibiotics and spent at least 5-10 minutes in consultation explaining this. Will use antihistamine bromax 11 mg BID prn for congestin for up to 2 weeks.   3. Disposition - The patient will be seen back in 4-6 weeks  (although she states she may not be able to make it in until the end of the school year) and will increase her BP meds to lisinopril/HCTZ 20/25 PO daily. Add metformin 500 mg BID (with slow taper up see A/P) and continue lantus 20 units nightly. Advised weight loss and exercise. Hemoccult cards given at today's visit. Patient was reminded of mammogram but declined at this time. Checked HgA1c which was 8.6 and microalbumin:creatinine ratio. Will check HIV and gonorrhea/chlamydia via urine at today's encounter given her sexual activity and preference for screening.

## 2012-06-12 ENCOUNTER — Telehealth: Payer: Self-pay | Admitting: *Deleted

## 2012-06-12 MED ORDER — BENZONATATE 100 MG PO CAPS: 100.0000 mg | ORAL_CAPSULE | Freq: Three times a day (TID) | ORAL | Status: DC | PRN

## 2012-06-12 NOTE — Addendum Note (Signed)
Addended by: Genella Mech A on: 06/12/2012 11:03 AM   Modules accepted: Orders, Medications

## 2012-06-12 NOTE — Telephone Encounter (Signed)
Pt calls and states her visit from 06/11/2012 because she feels it was handled inappropriately, she states she needs an antibiotic due to the fact she has a sinus infection, she also states there is no need for antihistamine nor a cough medicine. She may be called at [phone].

## 2012-06-12 NOTE — Telephone Encounter (Signed)
Called patient and discussed her care. She can take OTC Zyrtec once daily and use a decongestant nasal spray up to twice daily for the next week.  After this point, she can be re-evaluated. It sounds like this is viral in nature and an antibiotic is not necessary at this time.

## 2012-06-12 NOTE — Telephone Encounter (Signed)
Pt called [facility] and states Bromax was over $1000.00. Pt uses [facility] [facility] not [employer] [facility]. Missed work again today. Dr Dorise Hiss aware and will send in new Rx to [facility]. Stanton Kidney Ditzler RN 06/12/12 10:25AM

## 2012-06-13 ENCOUNTER — Encounter: Payer: Self-pay | Admitting: Internal Medicine

## 2012-07-04 ENCOUNTER — Telehealth: Payer: Self-pay | Admitting: *Deleted

## 2012-07-04 NOTE — Telephone Encounter (Signed)
Pt calls and leaves message has not rec'd a letter concerning her recent labs, especially STD screening, very concerned, could you please call her at [phone] and discuss the results

## 2012-07-05 NOTE — Telephone Encounter (Signed)
Pt called again today asking for call about lab results.  # [id number]

## 2012-07-06 NOTE — Telephone Encounter (Signed)
Letter sent by Dr Dorise Hiss.

## 2012-07-06 NOTE — Telephone Encounter (Signed)
I have not seen this patient. I tried calling her on 07/04/12 to discuss HIV and GC/C - she told me that wasn't her concern and she wanted to know about her kidney/cholesterol.  I didn't see these labs orders.  Looks like visit was under [facility], but patient told me Dorise Hiss saw her.  Thanks.

## 2012-07-09 ENCOUNTER — Telehealth: Payer: Self-pay | Admitting: *Deleted

## 2012-07-09 NOTE — Telephone Encounter (Signed)
Have called twice no answer, left message

## 2012-07-09 NOTE — Telephone Encounter (Signed)
Message copied by Rocco Pauls on Mon Jul 09, 2012  2:51 PM ------      Message from: Genella Mech A      Created: Fri Jul 06, 2012 10:30 AM       I think that she was mistaken. She will need kidney testing and cholesterol when she returns. We only checked STD testing.             Marisue Ivan      ----- Message -----         From: Rocco Pauls, RN         Sent: 07/06/2012  10:28 AM           To: Judie Bonus, MD                        ----- Message -----         From: Belia Heman, MD         Sent: 07/04/2012   6:10 PM           To: Rocco Pauls, RN            I phoned patient to give her HIV test results & GC/C per her request.  She told me she wasn't concerned about that, but rather wanted to know test results regarding kidneys and cholesterol.  I didn't see these results in the computer.  She told me the note in the computer was under [facility], but Amanda saw her.              Marisue Ivan- had ya'll talked about other labs? I've been blind to results today, so forgive me if I have missed in the in the computer.  Sorry - I know I'm this lady's PCP but I don't think I've ever met her :-/            Thanks,      Neema       ------

## 2012-07-17 ENCOUNTER — Other Ambulatory Visit: Payer: Self-pay | Admitting: Internal Medicine

## 2012-09-19 ENCOUNTER — Ambulatory Visit: Payer: BC Managed Care – PPO | Admitting: Internal Medicine

## 2012-09-21 ENCOUNTER — Ambulatory Visit (INDEPENDENT_AMBULATORY_CARE_PROVIDER_SITE_OTHER): Payer: BC Managed Care – PPO | Admitting: Internal Medicine

## 2012-09-21 ENCOUNTER — Encounter: Payer: Self-pay | Admitting: Internal Medicine

## 2012-09-21 VITALS — BP 154/95 | HR 63 | Temp 98.9°F | Ht 65.0 in | Wt 257.9 lb

## 2012-09-21 DIAGNOSIS — E119 Type 2 diabetes mellitus without complications: Secondary | ICD-10-CM

## 2012-09-21 LAB — POCT GLYCOSYLATED HEMOGLOBIN (HGB A1C): Hemoglobin A1C: 9.2

## 2012-09-21 LAB — GLUCOSE, CAPILLARY: Glucose-Capillary: 190 mg/dL — ABNORMAL HIGH (ref 70–99)

## 2012-09-21 NOTE — Patient Instructions (Addendum)
RESOURCE GUIDE     - Call [facility]  [phone] - can help you locate a primary care doctor that  accepts your insurance, provides certain services, etc. - Physician Referral Service- [phone]

## 2012-09-21 NOTE — Progress Notes (Signed)
Patient ID: Brenda Bowman, female   DOB: 24-Oct-1960, 52 y.o.   MRN: [id number]  Subjective:   Patient ID: Brenda Bowman female   DOB: [birth year] 52 y.o.   MRN: [id number]  HPI: [name] is a 52 y.o. female with history of T2DM, HTN, HLD presenting to the clinic for f/u.  In terms of DM, last A1c 8.6 on 06/11/2012. Per documentation from her last appointment, patient was counseled on the need to uptitrate her diabetes medicines in order to better control her blood sugar. At that time, she was taking Lantus 20 units intermittently. She was counseled on the need to start metformin therapy, but was hesitant to at this medication to her list. Per note review, she agreed with the plan to start taking metformin 250 mg daily for one week then increase to 250 mg BIDx1 week, then 500/250 x1 week, and finally up to 500 BID on week 4. It was recommended to continue lantus 20U daily.  She was advised to start taking lisinopril for blood pressure and renal protection, but declined. Recommended follow up was 1 month. On 06/13/12, a letter was mailed to the patient, informing her of signs of "damage to the kidneys from the diabetes," as microalb:Cr ratio returned elevated at 62.3 in March.   She returns today for followup. I asked her how she was feeling and she said she was doing okay. She asked about the damage to her kidneys from the diabetes. I reviewed the results of her urine microalbumin studies and discussed the implications of these findings given her poorly controlled diabetes. I asked her what medications she was currently taking for diabetes. She said she was taking insulin. I asked how many unit she was taking each night, and she had difficulty recalling this number,  eventually arriving at 30. I asked if she is started metformin and increase that as discussed at her prior visit, and she said that she had only been taking 250 mg once a day due to the side effects. I explained to her  that her hemoglobin A1c had risen to 9.2, up from her prior value of 8.6. I discussed with her that based on her worsening hyperglycemia and evidence of early damage to the kidneys from diabetes we would have to be more proactive about getting her diabetes under control.  Brenda Bowman was shocked to hear these results. She expressed to me her feeling that her diabetes is not being appropriately managed at this clinic. She was very upset by the idea of damage to her kidneys. I explained to her that there were steps we could take to get her blood sugar under control and help prevent further progression of disease. She was frustrated that no one had done anything to prevent the kidney damage. I mentioned to her that from her last visit notes, it appears that the doctor had recommended an ACE inhibitor for this very reason, but she had declined it. I also mentioned that the doctor had wanted to see her back in one month, but she did not come back for the appointment. She said she was busy with school. I told her that we would be happy to help her from here move forward with a plan that we could both agree on to help get her diabetes under better control. She stood up and said that she wanted to find a new doctor. I asked her if she wanted to talk specifically about her frustrations so we could better address  them or meet with our clinic managers to talk about the issues that displeased her. She refused. I told her that we would respect her right to find a new provider if desired, but would be happy to do everything today we could to assist her in her diabetes management and other health needs. She refused this as well.  She asked me for a list of new doctors. I stepped out of the room to find a resource list, and when I returned she had left the clinic.     Past Medical History  Diagnosis Date  . Hypertension   . Diabetes mellitus   . HLD (hyperlipidemia)   . 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 atenolol (TENORMIN) 50 MG tablet TAKE TWO TABLETS BY MOUTH EVERY DAY  60 tablet  3  . benzonatate (TESSALON PERLES) 100 MG capsule Take 1 capsule (100 mg total) by mouth 3 (three) times daily as needed for cough.  20 capsule  0  . diclofenac sodium (VOLTAREN) 1 % GEL Apply 2 g topically 2 (two) times daily as needed (pain).  100 g  0  . fish oil-omega-3 fatty acids 1000 MG capsule Take 2 g by mouth daily.      . folic acid (FOLVITE) 1 MG tablet Take 1 tablet (1 mg total) by mouth daily.  90 tablet  2  . glucose blood (TRUETRACK TEST) test strip Use to test blood sugar twice a day (Before breakfast and dinner.)       . hydrochlorothiazide (HYDRODIURIL) 25 MG tablet Take 1 tablet (25 mg total) by mouth daily.  30 tablet  5  . insulin glargine (LANTUS) 100 UNIT/ML injection Inject 20 Units into the skin daily.      . Insulin Pen Needle (SURE COMFORT PEN NEEDLES) 31G X 5 MM MISC Use to inject insulin once a day.       . Lancets 30G MISC Use to test blood sugar 2 times a day.       . metFORMIN (GLUCOPHAGE) 500 MG tablet Take 1 tablet (500 mg total) by mouth 2 (two) times daily with a meal.  60 tablet  11  . Prenatal Multivit-Min-Fe-FA 0.1 MG TABS Take 1 tablet (0.1 mg total) by mouth daily.  60 tablet  6   No current facility-administered medications for this visit.   Family History  Problem Relation Age of Onset  . Stroke Neg Hx   . Miscarriages / Stillbirths Neg Hx   . Heart disease Neg Hx   . Cancer Neg Hx    History   Social History  . Marital Status: Divorced    Spouse Name: N/A    Number of Children: N/A  . Years of Education: N/A   Social History Main Topics  . Smoking status: Never Smoker   . Smokeless tobacco: None  . Alcohol Use: No  . Drug Use: No  . Sexually Active: Yes   Other Topics Concern  . None   Social History  Narrative   Financial assistance approved for 100% discount at [employer] and has [facility] card   [employer]  February 02, 2010 12:30 PM   Will send msg to clinic directors regarding encounter. No charge for today.

## 2012-09-21 NOTE — Progress Notes (Signed)
I discussed patient with resident Dr.Ziemer after patient had left the clinic.

## 2012-09-24 ENCOUNTER — Other Ambulatory Visit: Payer: Self-pay | Admitting: *Deleted

## 2012-09-24 DIAGNOSIS — E119 Type 2 diabetes mellitus without complications: Secondary | ICD-10-CM

## 2012-09-24 DIAGNOSIS — I10 Essential (primary) hypertension: Secondary | ICD-10-CM

## 2012-09-25 MED ORDER — HYDROCHLOROTHIAZIDE 25 MG PO TABS: 25.0000 mg | ORAL_TABLET | Freq: Every day | ORAL | Status: DC

## 2012-09-25 MED ORDER — ATENOLOL 50 MG PO TABS: ORAL_TABLET | ORAL | Status: DC

## 2012-09-25 MED ORDER — INSULIN GLARGINE 100 UNIT/ML ~~LOC~~ SOLN: 20.0000 [IU] | Freq: Every day | SUBCUTANEOUS | Status: DC

## 2012-10-01 ENCOUNTER — Telehealth: Payer: Self-pay | Admitting: *Deleted

## 2012-10-01 NOTE — Telephone Encounter (Signed)
Pt called upset due to change in insulin directions - pt states she is on Lantus solostar 30 units daily not 20 units daily. Pt wants to see Dr Everardo Beals in 10/2012. Ellwood Dense to call pt and sch appt with Dr  Everardo Beals. Pt needs new Rx called in to [facility]. Stanton Kidney Liev Brockbank RN 10/01/12 11:15AM

## 2012-10-03 MED ORDER — INSULIN GLARGINE 100 UNIT/ML SOLOSTAR PEN: 30.0000 [IU] | PEN_INJECTOR | Freq: Every day | SUBCUTANEOUS | Status: DC

## 2012-10-08 ENCOUNTER — Encounter: Payer: Self-pay | Admitting: Internal Medicine

## 2012-10-08 ENCOUNTER — Ambulatory Visit (INDEPENDENT_AMBULATORY_CARE_PROVIDER_SITE_OTHER): Payer: BC Managed Care – PPO | Admitting: Internal Medicine

## 2012-10-08 VITALS — BP 138/89 | HR 76 | Temp 97.9°F | Ht 65.0 in | Wt 254.5 lb

## 2012-10-08 DIAGNOSIS — E119 Type 2 diabetes mellitus without complications: Secondary | ICD-10-CM

## 2012-10-08 DIAGNOSIS — M545 Low back pain, unspecified: Secondary | ICD-10-CM

## 2012-10-08 DIAGNOSIS — I10 Essential (primary) hypertension: Secondary | ICD-10-CM

## 2012-10-08 LAB — GLUCOSE, CAPILLARY: Glucose-Capillary: 231 mg/dL — ABNORMAL HIGH (ref 70–99)

## 2012-10-08 MED ORDER — HYDROCHLOROTHIAZIDE 25 MG PO TABS: 25.0000 mg | ORAL_TABLET | Freq: Every day | ORAL | Status: DC

## 2012-10-08 MED ORDER — GLIPIZIDE 2.5 MG HALF TABLET: 2.5000 mg | ORAL_TABLET | Freq: Every day | ORAL | Status: DC

## 2012-10-08 MED ORDER — DICLOFENAC SODIUM 1 % TD GEL: 2.0000 g | Freq: Two times a day (BID) | TRANSDERMAL | Status: AC | PRN

## 2012-10-08 NOTE — Progress Notes (Signed)
  Subjective:    Patient ID: Brenda Bowman, female    DOB: 1960/08/16, 52 y.o.   MRN: [id number]  HPI Comments: She likes to go by Brenda Bowman though spelled differently.  52 y.o PMH DM 2 (uncontrolled HA1C 9.2 09/21/12, cbg 231 today), HTN (BP 138/89), dyslipidemia.  She presents for DM follow up.  She was unable to tolerate Metformin due to diarrhea.  She was taking Lantus solostar 30 units qam.  She states she shares a meter with someone and her readings are 180s-190s normally.  She states today she ate yogurt, iced hazelnut coffee from [employer].  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  She is taking Foster parenting classes for temporary foster care.  She wants Rx refills of Voltaren gel, HCTZ 25.  She recently joined the [employer] again since she is not working for the summer and she is going every 2 days swimming trying to lose weight.    SH: she does not have kids.  She has 2 sisters and 2 brothers.       Review of Systems  HENT: Negative for sore throat.   Cardiovascular: Negative for chest pain and leg swelling.  Gastrointestinal: Negative for diarrhea.       Objective:   Physical Exam  Nursing note and vitals reviewed. Constitutional: She is oriented to person, place, and time. Vital signs are normal. She appears well-developed and well-nourished. She is cooperative. No distress.  HENT:  Head: Normocephalic and atraumatic.  Mouth/Throat: Oropharynx is clear and moist and mucous membranes are normal. No oropharyngeal exudate.  Eyes: Conjunctivae are normal. Pupils are equal, round, and reactive to light. Right eye exhibits no discharge. Left eye exhibits no discharge. No scleral icterus.  Cardiovascular: Normal rate, regular rhythm, S1 normal, S2 normal and normal heart sounds.   No murmur heard. Pulmonary/Chest: Effort normal and breath sounds normal. No respiratory distress. She has no wheezes.  Abdominal: Soft. Bowel sounds are normal.  There is no tenderness.  Obese ab  Neurological: She is alert and oriented to person, place, and time. Gait normal.  Skin: Skin is warm, dry and intact. No rash noted. She is not diaphoretic.  Psychiatric: She has a normal mood and affect. Her speech is normal and behavior is normal. Judgment and thought content normal. Cognition and memory are normal.          Assessment & Plan:  F/u in 3 months DM Review DM log at that time  Titrate DM meds as needed for better control

## 2012-10-08 NOTE — Assessment & Plan Note (Addendum)
Lab Results  Component Value Date   HGBA1C 9.2 09/21/2012   HGBA1C 8.6 06/11/2012   HGBA1C 8.2 12/19/2011     Assessment: Diabetes control: poor control (HgbA1C >9%) Progress toward A1C goal:  unable to assess Comments:trending back up HA1C  Plan: Medications:  continue current medications Added Glipizide 2.5 mg qam Home glucose monitoring: Frequency:  (shares a meter with someone else will log sugars ). Advised to check blood sugars tid  Timing:   Instruction/counseling given: reminded to get eye exam and other instruction/counseling: bring log to each visit. She has one scheduled 04/2012 Educational resources provided: brochure;handout Self management tools provided: home glucose logbook Other plans: Advised to log sugars and bring log to each visit since sharing a meter with someone else. Continue exercise. Will have DM educator meet with patient before next follow up to review BG log and advise provider

## 2012-10-08 NOTE — Assessment & Plan Note (Signed)
BP Readings from Last 3 Encounters:  10/08/12 138/89  09/21/12 154/95  06/11/12 151/105    Lab Results  Component Value Date   NA 140 02/08/2012   K 4.3 02/08/2012   CREATININE 0.92 02/08/2012    Assessment: Blood pressure control: controlled Progress toward BP goal:  at goal Comments: none  Plan: Medications:  continue current medications Educational resources provided: brochure;handout Self management tools provided:   Other plans: none. Continue exercise

## 2012-10-08 NOTE — Patient Instructions (Addendum)
General Instructions: Follow up in 3 months  Log your sugars three times a day  Meet with the diabetes educator before 3 months  Pick up medications from pharmacy    Treatment Goals:  Goals (1 Years of Data) as of 10/08/12         As of Today 09/21/12 06/11/12 06/11/12 02/08/12     Blood Pressure    . Blood Pressure < 140/90  138/89 154/95 151/105 190/100 150/98     Result Component    . HEMOGLOBIN A1C < 7.0   9.2 8.6      . HEMOGLOBIN A1C < 7.0   9.2 8.6      . LDL CALC < 100            Progress Toward Treatment Goals:  Treatment Goal 10/08/2012  Hemoglobin A1C unable to assess  Blood pressure at goal    Self Care Goals & Plans:  Self Care Goal 10/08/2012  Manage my medications take my medicines as prescribed; bring my medications to every visit; refill my medications on time  Monitor my health keep track of my weight; keep track of my blood glucose  Eat healthy foods drink diet soda or water instead of juice or soda; eat more vegetables; eat foods that are low in salt; eat baked foods instead of fried foods; eat fruit for snacks and desserts; eat smaller portions  Be physically active find an activity I enjoy  Meeting treatment goals maintain the current self-care plan    Home Blood Glucose Monitoring 10/08/2012  Check my blood sugar (No Data)  When to check my blood sugar -     Care Management & Community Referrals:  Referral 10/08/2012  Referrals made for care management support diabetes educator  Referrals made to community resources none       Glipizide tablets What is this medicine? GLIPIZIDE (GLIP i zide) helps to treat type 2 diabetes. Treatment is combined with diet and exercise. The medicine helps your body to use insulin better. This medicine may be used for other purposes; ask your health care provider or pharmacist if you have questions. What should I tell my health care provider before I take this medicine? They need to know if you have any of these  conditions: -diabetic ketoacidosis -glucose-6-phosphate dehydrogenase deficiency -heart disease -kidney disease -liver disease -porphyria -severe infection or injury -thyroid disease -an unusual or allergic reaction to glipizide, sulfa drugs, other medicines, foods, dyes, or preservatives -pregnant or trying to get pregnant -breast-feeding How should I use this medicine? Take this medicine by mouth. Swallow with a drink of water. Do not take with food. Take it 30 minutes before a meal. Follow the directions on the prescription label. If you take this medicine once a day, take it 30 minutes before breakfast. Take your doses at the same time each day. Do not take more often than directed. Talk to your pediatrician regarding the use of this medicine in children. Special care may be needed. Elderly patients over 89 years old may have a stronger reaction and need a smaller dose. Overdosage: If you think you have taken too much of this medicine contact a poison control center or emergency room at once. NOTE: This medicine is only for you. Do not share this medicine with others. What if I miss a dose? If you miss a dose, take it as soon as you can. If it is almost time for your next dose, take only that dose. Do not take  double or extra doses. What may interact with this medicine? -bosentan -chloramphenicol -cisapride -clarithromycin -medicines for fungal or yeast infections -metoclopramide -probenecid -warfarin Many medications may cause an increase or decrease in blood sugar, these include: -alcohol containing beverages -aspirin and aspirin-like drugs -chloramphenicol -chromium -diuretics -female hormones, like estrogens or progestins and birth control pills -heart medicines -isoniazid -female hormones or anabolic steroids -medicines for weight loss -medicines for allergies, asthma, cold, or cough -medicines for mental problems -medicines called MAO Inhibitors like Nardil, Parnate,  Marplan, Eldepryl -niacin -NSAIDs, medicines for pain and inflammation, like ibuprofen or naproxen -pentamidine -phenytoin -probenecid -quinolone antibiotics like ciprofloxacin, levofloxacin, ofloxacin -some herbal dietary supplements -steroid medicines like prednisone or cortisone -thyroid medicine This list may not describe all possible interactions. Give your health care provider a list of all the medicines, herbs, non-prescription drugs, or dietary supplements you use. Also tell them if you smoke, drink alcohol, or use illegal drugs. Some items may interact with your medicine. What should I watch for while using this medicine? Visit your doctor or health care professional for regular checks on your progress. Learn how to check your blood sugar. Tell your doctor or health care professional if your blood sugar is high, you might need to change the dose of your medicine. If you are sick or exercising more than usual, you might need to change the dose of your medicine. Do not skip meals. Ask your doctor or health care professional if you should avoid alcohol. If you have symptoms of low blood sugar, eat or drink something containing sugar at once and contact your doctor or health care professional. Make sure family members know that you can choke if you eat or drink when you develop serious symptoms of low blood sugar, like seizures or unconsciousness. They must get medical help at once. This medicine can make you more sensitive to the sun. Keep out of the sun. If you cannot avoid being in the sun, wear protective clothing and use sunscreen. Do not use sun lamps or tanning beds/booths. Wear a medical identification bracelet or chain to say you have diabetes, and carry a card that lists all your medications. What side effects may I notice from receiving this medicine? Side effects that you should report to your doctor or health care professional as soon as possible: -breathing difficulties -dark  yellow or brown urine, or yellowing of the eyes or skin -fever, chills, sore throat -low blood sugar (ask your doctor or healthcare professional for a list of these symptoms) -severe skin rash, redness, swelling, or itching -unusual bleeding or bruising Side effects that usually do not require medical attention (report to your doctor or health care professional if they continue or are bothersome): -diarrhea -headache -heartburn -nausea, vomiting -stomach discomfort This list may not describe all possible side effects. Call your doctor for medical advice about side effects. You may report side effects to FDA at 1-800-FDA-1088. Where should I keep my medicine? Keep out of the reach of children. Store at room temperature below 30 degrees C (86 degrees F). Throw away any unused medicine after the expiration date. NOTE: This sheet is a summary. It may not cover all possible information. If you have questions about this medicine, talk to your doctor, pharmacist, or health care provider.  2012, [employer]. (08/01/2007 10:46:56 AM) Hypertension As your heart beats, it forces blood through your arteries. This force is your blood pressure. If the pressure is too high, it is called hypertension (HTN) or high blood pressure.  HTN is dangerous because you may have it and not know it. High blood pressure may mean that your heart has to work harder to pump blood. Your arteries may be narrow or stiff. The extra work puts you at risk for heart disease, stroke, and other problems.  Blood pressure consists of two numbers, a higher number over a lower, 110/72, for example. It is stated as "110 over 72." The ideal is below 120 for the top number (systolic) and under 80 for the bottom (diastolic). Write down your blood pressure today. You should pay close attention to your blood pressure if you have certain conditions such as:  Heart failure.  Prior heart attack.  Diabetes  Chronic kidney  disease.  Prior stroke.  Multiple risk factors for heart disease. To see if you have HTN, your blood pressure should be measured while you are seated with your arm held at the level of the heart. It should be measured at least twice. A one-time elevated blood pressure reading (especially in the Emergency Department) does not mean that you need treatment. There may be conditions in which the blood pressure is different between your right and left arms. It is important to see your caregiver soon for a recheck. Most people have essential hypertension which means that there is not a specific cause. This type of high blood pressure may be lowered by changing lifestyle factors such as:  Stress.  Smoking.  Lack of exercise.  Excessive weight.  Drug/tobacco/alcohol use.  Eating less salt. Most people do not have symptoms from high blood pressure until it has caused damage to the body. Effective treatment can often prevent, delay or reduce that damage. TREATMENT  When a cause has been identified, treatment for high blood pressure is directed at the cause. There are a large number of medications to treat HTN. These fall into several categories, and your caregiver will help you select the medicines that are best for you. Medications may have side effects. You should review side effects with your caregiver. If your blood pressure stays high after you have made lifestyle changes or started on medicines,   Your medication(s) may need to be changed.  Other problems may need to be addressed.  Be certain you understand your prescriptions, and know how and when to take your medicine.  Be sure to follow up with your caregiver within the time frame advised (usually within two weeks) to have your blood pressure rechecked and to review your medications.  If you are taking more than one medicine to lower your blood pressure, make sure you know how and at what times they should be taken. Taking two medicines  at the same time can result in blood pressure that is too low. SEEK IMMEDIATE MEDICAL CARE IF:  You develop a severe headache, blurred or changing vision, or confusion.  You have unusual weakness or numbness, or a faint feeling.  You have severe chest or abdominal pain, vomiting, or breathing problems. MAKE SURE YOU:   Understand these instructions.  Will watch your condition.  Will get help right away if you are not doing well or get worse. Document Released: 03/21/2005 Document Revised: 06/13/2011 Document Reviewed: 11/09/2007 [facility] Patient Information 2014 [place], [place].  Diabetes, Eating Away From Home Sometimes, you might eat in a restaurant or have meals that are prepared by someone else. You can enjoy eating out. However, the portions in restaurants may be much larger than needed. Listed below are some ideas to help you choose  foods that will keep your blood glucose (sugar) in better control.  TIPS FOR EATING OUT  Know your meal plan and how many carbohydrate servings you should have at each meal. You may wish to carry a copy of your meal plan in your purse or wallet. Learn the foods included in each food group.  Make a list of restaurants near you that offer healthy choices. Take a copy of the carry-out menus to see what they offer. Then, you can plan what you will order ahead of time.  Become familiar with serving sizes by practicing them at home using measuring cups and spoons. Once you learn to recognize portion sizes, you will be able to correctly estimate the amount of total carbohydrate you are allowed to eat at the restaurant. Ask for a takeout box if the portion is more than you should have. When your food comes, leave the amount you should have on the plate, and put the rest in the takeout box before you start eating.  Plan ahead if your mealtime will be different from usual. Check with your caregiver to find out how to time meals and medicine if you are taking  insulin.  Avoid high-fat foods, such as fried foods, cream sauces, high-fat salad dressings, or any added butter or margarine.  Do not be afraid to ask questions. Ask your server about the portion size, cooking methods, ingredients and if items can be substituted. Restaurants do not list all available items on the menu. You can ask for your main entree to be prepared using skim milk, oil instead of butter or margarine, and without gravy or sauces. Ask your waiter or waitress to serve salad dressings, gravy, sauces, margarine, and sour cream on the side. You can then add the amount your meal plan suggests.  Add more vegetables whenever possible.  Avoid items that are labeled "jumbo," "giant," "deluxe," or "supersized."  You may want to split an entre with someone and order an extra side salad.  Watch for hidden calories in foods like croutons, bacon, or cheese.  Ask your server to take away the bread basket or chips from your table.  Order a dinner salad as an appetizer. You can eat most foods served in a restaurant. Some foods are better choices than others. Breads and Starches  Recommended: All kinds of bread (wheat, rye, white, oatmeal, [place], [place], raisin), hard or soft dinner rolls, frankfurter or hamburger buns, small bagels, small corn or whole-wheat flour tortillas.  Avoid: Frosted or glazed breads, butter rolls, egg or cheese breads, croissants, sweet rolls, pastries, coffee cake, glazed or frosted doughnuts, muffins. Crackers  Recommended: Animal crackers, graham, rye, saltine, oyster, and matzoth crackers. Bread sticks, melba toast, rusks, pretzels, popcorn (without fat), zwieback toast.  Avoid: High-fat snack crackers or chips. Buttered popcorn. Cereals  Recommended: Hot and cold cereals. Whole grains such as oatmeal or shredded wheat are good choices.  Avoid: Sugar-coated or granola type cereals. Potatoes/Pasta/Rice/Beans  Recommended: Order baked, boiled, or  mashed potatoes, rice or noodles without added fat, whole beans. Order gravies, butter, margarine, or sauces on the side so you can control the amount you add.  Avoid: Hash browns or fried potatoes. Potatoes, pasta, or rice prepared with cream or cheese sauce. Potato or pasta salads prepared with large amounts of dressing. Fried beans or fried rice. Vegetables  Recommended: Order steamed, baked, boiled, or stewed vegetables without sauces or extra fat. Ask that sauce be served on the side. If vegetables are not listed  on the menu, ask what is available.  Avoid: Vegetables prepared with cream, butter, or cheese sauce. Fried vegetables. Salad Bars  Recommended: Many of the vegetables at a salad bar are considered "free." Use lemon juice, vinegar, or low-calorie salad dressing (fewer than 20 calories per serving) as "free" dressings for your salad. Look for salad bar ingredients that have no added fat or sugar such as tomatoes, lettuce, cucumbers, broccoli, carrots, onions, and mushrooms.  Avoid: Prepared salads with large amounts of dressing, such as coleslaw, caesar salad, macaroni salad, bean salad, or carrot salad. Fruit  Recommended: Eat fresh fruit or fresh fruit salad without added dressing. A salad bar often offers fresh fruit choices, but canned fruit at a restaurant is usually packed in sugar or syrup.  Avoid: Sweetened canned or frozen fruits, plain or sweetened fruit juice. Fruit salads with dressing, sour cream, or sugar added to them. Meat and Meat Substitutes  Recommended: Order broiled, baked, roasted, or grilled meat, poultry, or fish. Trim off all visible fat. Do not eat the skin of poultry. The size stated on the menu is the raw weight. Meat shrinks by  in cooking (for example, 4 oz raw equals 3 oz cooked meat).  Avoid: Deep-fat fried meat, poultry, or fish. Breaded meats. Eggs  Recommended: Order soft, hard-cooked, poached, or scrambled eggs. Omelets may be okay, depending  on what ingredients are added. Egg substitutes are also a good choice.  Avoid: Fried eggs, eggs prepared with cream or cheese sauce. Milk  Recommended: Order low-fat or fat-free milk according to your meal plan. Plain, nonfat yogurt or flavored yogurt with no sugar added may be used as a substitute for milk. Soy milk may also be used.  Avoid: Milk shakes or sweetened milk beverages. Soups and Combination Foods  Recommended: Clear broth or consomm are "free" foods and may be used as an appetizer. Broth-based soups with fat removed count as a starch serving and are preferred over cream soups. Soups made with beans or split peas may be eaten but count as a starch.  Avoid: Fatty soups, soup made with cream, cheese soup. Combination foods prepared with excessive amounts of fat or with cream or cheese sauces. Desserts and Sweets  Recommended: Ask for fresh fruit. Sponge or angel food cake without icing, ice milk, no sugar added ice cream, sherbet, or frozen yogurt may fit into your meal plan occasionally.  Avoid: Pastries, puddings, pies, cakes with icing, custard, gelatin desserts. Fats and Oils  Recommended: Choose healthy fats such as olive oil, canola oil, or tub margarine, reduced fat or fat-free sour cream, cream cheese, avocado, or nuts.  Avoid: Any fats in excess of your allowed portion. Deep-fried foods or any food with a large amount of fat. Note: Ask for all fats to be served on the side, and limit your portion sizes according to your meal plan. Document Released: 03/21/2005 Document Revised: 06/13/2011 Document Reviewed: 10/09/2008 [facility] Patient Information 2014 [place], [place]. Type 2 Diabetes Mellitus, Adult Type 2 diabetes mellitus, often simply referred to as type 2 diabetes, is a long-lasting (chronic) disease. In type 2 diabetes, the pancreas does not make enough insulin (a hormone), the cells are less responsive to the insulin that is made (insulin resistance), or both.  Normally, insulin moves sugars from food into the tissue cells. The tissue cells use the sugars for energy. The lack of insulin or the lack of normal response to insulin causes excess sugars to build up in the blood instead of going  into the tissue cells. As a result, high blood sugar (hyperglycemia) develops. The effect of high sugar (glucose) levels can cause many complications. Type 2 diabetes was also previously called adult-onset diabetes but it can occur at any age.  RISK FACTORS  A person is predisposed to developing type 2 diabetes if someone in the family has the disease and also has one or more of the following primary risk factors:  Overweight.  An inactive lifestyle.  A history of consistently eating high-calorie foods. Maintaining a normal weight and regular physical activity can reduce the chance of developing type 2 diabetes. SYMPTOMS  A person with type 2 diabetes may not show symptoms initially. The symptoms of type 2 diabetes appear slowly. The symptoms include:  Increased thirst (polydipsia).  Increased urination (polyuria).  Increased urination during the night (nocturia).  Weight loss. This weight loss may be rapid.  Frequent, recurring infections.  Tiredness (fatigue).  Weakness.  Vision changes, such as blurred vision.  Fruity smell to your breath.  Abdominal pain.  Nausea or vomiting.  Cuts or bruises which are slow to heal.  Tingling or numbness in the hands or feet. DIAGNOSIS Type 2 diabetes is frequently not diagnosed until complications of diabetes are present. Type 2 diabetes is diagnosed when symptoms or complications are present and when blood glucose levels are increased. Your blood glucose level may be checked by one or more of the following blood tests:  A fasting blood glucose test. You will not be allowed to eat for at least 8 hours before a blood sample is taken.  A random blood glucose test. Your blood glucose is checked at any time  of the day regardless of when you ate.  A hemoglobin A1c blood glucose test. A hemoglobin A1c test provides information about blood glucose control over the previous 3 months.  An oral glucose tolerance test (OGTT). Your blood glucose is measured after you have not eaten (fasted) for 2 hours and then after you drink a glucose-containing beverage. TREATMENT   You may need to take insulin or diabetes medicine daily to keep blood glucose levels in the desired range.  You will need to match insulin dosing with exercise and healthy food choices. The treatment goal is to maintain the before meal blood sugar (preprandial glucose) level at 70 130 mg/dL. HOME CARE INSTRUCTIONS   Have your hemoglobin A1c level checked twice a year.  Perform daily blood glucose monitoring as directed by your caregiver.  Monitor urine ketones when you are ill and as directed by your caregiver.  Take your diabetes medicine or insulin as directed by your caregiver to maintain your blood glucose levels in the desired range.  Never run out of diabetes medicine or insulin. It is needed every day.  Adjust insulin based on your intake of carbohydrates. Carbohydrates can raise blood glucose levels but need to be included in your diet. Carbohydrates provide vitamins, minerals, and fiber which are an essential part of a healthy diet. Carbohydrates are found in fruits, vegetables, whole grains, dairy products, legumes, and foods containing added sugars.    Eat healthy foods. Alternate 3 meals with 3 snacks.  Lose weight if overweight.  Carry a medical alert card or wear your medical alert jewelry.  Carry a 15 gram carbohydrate snack with you at all times to treat low blood glucose (hypoglycemia). Some examples of 15 gram carbohydrate snacks include:  Glucose tablets, 3 or 4   Glucose gel, 15 gram tube  Raisins, 2 tablespoons (24  grams)  Jelly beans, 6  Animal crackers, 8  Regular pop, 4 ounces (120  mL)  Gummy treats, 9  Recognize hypoglycemia. Hypoglycemia occurs with blood glucose levels of 70 mg/dL and below. The risk for hypoglycemia increases when fasting or skipping meals, during or after intense exercise, and during sleep. Hypoglycemia symptoms can include:  Tremors or shakes.  Decreased ability to concentrate.  Sweating.  Increased heart rate.  Headache.  Dry mouth.  Hunger.  Irritability.  Anxiety.  Restless sleep.  Altered speech or coordination.  Confusion.  Treat hypoglycemia promptly. If you are alert and able to safely swallow, follow the 15:15 rule:  Take 15 20 grams of rapid-acting glucose or carbohydrate. Rapid-acting options include glucose gel, glucose tablets, or 4 ounces (120 mL) of fruit juice, regular soda, or low fat milk.  Check your blood glucose level 15 minutes after taking the glucose.  Take 15 20 grams more of glucose if the repeat blood glucose level is still 70 mg/dL or below.  Eat a meal or snack within 1 hour once blood glucose levels return to normal.    Be alert to polyuria and polydipsia which are early signs of hyperglycemia. An early awareness of hyperglycemia allows for prompt treatment. Treat hyperglycemia as directed by your caregiver.  Engage in at least 150 minutes of moderate-intensity physical activity a week, spread over at least 3 days of the week or as directed by your caregiver. In addition, you should engage in resistance exercise at least 2 times a week or as directed by your caregiver.  Adjust your medicine and food intake as needed if you start a new exercise or sport.  Follow your sick day plan at any time you are unable to eat or drink as usual.  Avoid tobacco use.  Limit alcohol intake to no more than 1 drink per day for nonpregnant women and 2 drinks per day for men. You should drink alcohol only when you are also eating food. Talk with your caregiver whether alcohol is safe for you. Tell your  caregiver if you drink alcohol several times a week.  Follow up with your caregiver regularly.  Schedule an eye exam soon after the diagnosis of type 2 diabetes and then annually.  Perform daily skin and foot care. Examine your skin and feet daily for cuts, bruises, redness, nail problems, bleeding, blisters, or sores. A foot exam by a caregiver should be done annually.  Brush your teeth and gums at least twice a day and floss at least once a day. Follow up with your dentist regularly.  Share your diabetes management plan with your workplace or school.  Stay up-to-date with immunizations.  Learn to manage stress.  Obtain ongoing diabetes education and support as needed.  Participate in, or seek rehabilitation as needed to maintain or improve independence and quality of life. Request a physical or occupational therapy referral if you are having foot or hand numbness or difficulties with grooming, dressing, eating, or physical activity. SEEK MEDICAL CARE IF:   You are unable to eat food or drink fluids for more than 6 hours.  You have nausea and vomiting for more than 6 hours.  Your blood glucose level is over 240 mg/dL.  There is a change in mental status.  You develop an additional serious illness.  You have diarrhea for more than 6 hours.  You have been sick or have had a fever for a couple of days and are not getting better.  You have  pain during any physical activity.  SEEK IMMEDIATE MEDICAL CARE IF:  You have difficulty breathing.  You have moderate to large ketone levels. MAKE SURE YOU:  Understand these instructions.  Will watch your condition.  Will get help right away if you are not doing well or get worse. Document Released: 03/21/2005 Document Revised: 12/14/2011 Document Reviewed: 10/18/2011 [facility] Patient Information 2014 [place], [place].

## 2012-10-09 NOTE — Progress Notes (Signed)
Case discussed with Dr. McLean at the time of the visit.  We reviewed the resident's history and exam and pertinent patient test results.  I agree with the assessment, diagnosis, and plan of care documented in the resident's note.     

## 2012-10-11 ENCOUNTER — Other Ambulatory Visit: Payer: Self-pay

## 2012-10-11 ENCOUNTER — Encounter: Payer: Self-pay | Admitting: *Deleted

## 2012-10-11 NOTE — Progress Notes (Unsigned)
Asked by Raynaldo Opitz to call pt and discuss med list discreptantcies with pt, she had called and left him a message stating her med list is not correct at her appt end on 10/10/2012. i called, left a vmail asking her to call me directly at [phone]. i called her at [phone]

## 2012-10-12 ENCOUNTER — Telehealth: Payer: Self-pay | Admitting: *Deleted

## 2012-10-12 NOTE — Telephone Encounter (Signed)
I have documentation that pt has tried and failed two other NSAID

## 2012-10-15 ENCOUNTER — Telehealth: Payer: Self-pay | Admitting: Dietician

## 2012-10-15 ENCOUNTER — Other Ambulatory Visit: Payer: Self-pay | Admitting: Dietician

## 2012-10-15 DIAGNOSIS — E119 Type 2 diabetes mellitus without complications: Secondary | ICD-10-CM

## 2012-10-15 NOTE — Telephone Encounter (Signed)
Called patient to schedule an appointment. Patient was driving and we got disconnected.

## 2012-10-15 NOTE — Addendum Note (Signed)
Addended by: Annett Gula on: 10/15/2012 05:01 PM   Modules accepted: Orders

## 2012-10-23 NOTE — Telephone Encounter (Signed)
Medication was approved until 10/23/12. Case ID [id number]  Pharmacy informed.Brenda Spittle Cassady7/22/201412:49 PM   (Pharmacy did run test claim while I held on phone and medication was still being denied.  Suggested we wait until after lunch to re-run. If medication is still being denied, I will contact pt's insurance again at [phone])

## 2012-11-15 ENCOUNTER — Other Ambulatory Visit: Payer: Self-pay | Admitting: Internal Medicine

## 2012-11-15 NOTE — Progress Notes (Signed)
Brenda Bowman,  Please help me to understand what exactly you need signed. I see you originally sent the note to [facility]. I am unclear what needs to be done. Thank you!  Fleet Contras

## 2012-11-16 NOTE — Progress Notes (Signed)
I need the refferal signed. It is already in the meds & orders section fo this note ready to be signed. I need it signed before I see her next week please.  Thank you! If you have trouble - Purnell Shoemaker or I can help you.

## 2012-11-21 NOTE — Progress Notes (Signed)
Patient ID: Brenda Bowman, female   DOB: [birth year], 52 y.o.   MRN: [id number] Lupita Leash,  I signed the order as requested. Thank you.  Fleet Contras

## 2012-11-22 ENCOUNTER — Encounter: Payer: Self-pay | Admitting: Internal Medicine

## 2012-11-22 ENCOUNTER — Encounter: Payer: BC Managed Care – PPO | Admitting: Dietician

## 2012-11-27 NOTE — Addendum Note (Signed)
Addended by: Neomia Dear on: 11/27/2012 06:56 PM   Modules accepted: Orders

## 2012-12-31 ENCOUNTER — Emergency Department (HOSPITAL_COMMUNITY)
Admission: EM | Admit: 2012-12-31 | Discharge: 2012-12-31 | Disposition: A | Discharge status: Home / Self Care | Payer: BC Managed Care – PPO | Attending: Emergency Medicine | Admitting: Emergency Medicine

## 2012-12-31 ENCOUNTER — Emergency Department (HOSPITAL_COMMUNITY): Payer: BC Managed Care – PPO

## 2012-12-31 ENCOUNTER — Encounter (HOSPITAL_COMMUNITY): Payer: Self-pay | Admitting: Nurse Practitioner

## 2012-12-31 DIAGNOSIS — E785 Hyperlipidemia, unspecified: Secondary | ICD-10-CM | POA: Insufficient documentation

## 2012-12-31 DIAGNOSIS — E119 Type 2 diabetes mellitus without complications: Secondary | ICD-10-CM | POA: Insufficient documentation

## 2012-12-31 DIAGNOSIS — I10 Essential (primary) hypertension: Secondary | ICD-10-CM | POA: Insufficient documentation

## 2012-12-31 DIAGNOSIS — R079 Chest pain, unspecified: Secondary | ICD-10-CM

## 2012-12-31 DIAGNOSIS — R071 Chest pain on breathing: Secondary | ICD-10-CM | POA: Insufficient documentation

## 2012-12-31 DIAGNOSIS — Z885 Allergy status to narcotic agent status: Secondary | ICD-10-CM | POA: Insufficient documentation

## 2012-12-31 DIAGNOSIS — R110 Nausea: Secondary | ICD-10-CM | POA: Insufficient documentation

## 2012-12-31 DIAGNOSIS — Z8742 Personal history of other diseases of the female genital tract: Secondary | ICD-10-CM | POA: Insufficient documentation

## 2012-12-31 DIAGNOSIS — R0602 Shortness of breath: Secondary | ICD-10-CM | POA: Insufficient documentation

## 2012-12-31 DIAGNOSIS — Z794 Long term (current) use of insulin: Secondary | ICD-10-CM | POA: Insufficient documentation

## 2012-12-31 DIAGNOSIS — Z79899 Other long term (current) drug therapy: Secondary | ICD-10-CM | POA: Insufficient documentation

## 2012-12-31 DIAGNOSIS — Z888 Allergy status to other drugs, medicaments and biological substances status: Secondary | ICD-10-CM | POA: Insufficient documentation

## 2012-12-31 DIAGNOSIS — F411 Generalized anxiety disorder: Secondary | ICD-10-CM | POA: Insufficient documentation

## 2012-12-31 HISTORY — DX: Anxiety disorder, unspecified: F41.9

## 2012-12-31 LAB — CBC
HCT: 39.7 % (ref 36.0–46.0)
MCHC: 33.8 g/dL (ref 30.0–36.0)
Platelets: 281 10*3/uL (ref 150–400)
RBC: 4.74 MIL/uL (ref 3.87–5.11)
RDW: 14.3 % (ref 11.5–15.5)
WBC: 7.7 10*3/uL (ref 4.0–10.5)

## 2012-12-31 LAB — BASIC METABOLIC PANEL
BUN: 14 mg/dL (ref 6–23)
Chloride: 97 mEq/L (ref 96–112)
GFR calc Af Amer: >90 mL/min (ref >90)
GFR calc non Af Amer: 84 mL/min — ABNORMAL LOW (ref >90)
Glucose, Bld: 125 mg/dL — ABNORMAL HIGH (ref 70–99)
Potassium: 3.5 mEq/L (ref 3.5–5.1)
Sodium: 140 mEq/L (ref 135–145)

## 2012-12-31 LAB — POCT I-STAT TROPONIN I: Troponin i, poc: 0 ng/mL (ref 0.00–0.08)

## 2012-12-31 NOTE — ED Provider Notes (Signed)
CSN: [id number]     Arrival date & time 12/31/12  1625 History   First MD Initiated Contact with Patient 12/31/12 2108     Chief Complaint  Patient presents with  . Chest Pain   (Consider location/radiation/quality/duration/timing/severity/associated sxs/prior Treatment) HPI Since early this morning constant over 12 hours mild right chest wall tenderness positional nonexertional nonpleuritic had minimal SOB/nausea this morning, no recent surgery or immobilization, no fever/cough, no edema or leg pain, no PMH DVT/PE or CA, pain radiated to right arm this AM, no sudden focal or lateralizing neuro Sxs. Past Medical History  Diagnosis Date  . Hypertension   . Diabetes mellitus   . HLD (hyperlipidemia)   . 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 Anxiety    Past Surgical History  Procedure Laterality Date  . Bilateral oophorectomy     Family History  Problem Relation Age of Onset  . Stroke Neg Hx   . Miscarriages / Stillbirths Neg Hx   . Heart disease Neg Hx   . Cancer Neg Hx    History  Substance Use Topics  . Smoking status: Never Smoker   . Smokeless tobacco: Not on file  . Alcohol Use: No   OB History   Grav Para Term Preterm Abortions TAB SAB Ect Mult Living                 Review of Systems 10 Systems reviewed and are negative for acute change except as noted in the HPI. Allergies  Codeine; Metformin and related; and Morphine  Home Medications   Current Outpatient Rx  Name  Route  Sig  Dispense  Refill  . acetaminophen (TYLENOL) 500 MG tablet   Oral   Take 1,000 mg by mouth every 6 (six) hours as needed for pain.         Marland Kitchen atenolol (TENORMIN) 25 MG tablet   Oral   Take 25 mg by mouth 2 (two) times daily.         . diclofenac sodium (VOLTAREN) 1 % GEL   Topical   Apply 2 g topically 2 (two) times daily as needed (pain).   100 g   1   .  DM-Phenylephrine-Acetaminophen (ALKA-SELTZER PLS SINUS & COUGH) 10-5-325 MG CAPS   Oral   Take 2 capsules by mouth as needed (cold).         Marland Kitchen escitalopram (LEXAPRO) 5 MG tablet   Oral   Take 5 mg by mouth daily.         Marland Kitchen glipiZIDE (GLUCOTROL) 5 MG tablet   Oral   Take 5 mg by mouth 2 (two) times daily before a meal.         . hydrochlorothiazide (HYDRODIURIL) 25 MG tablet   Oral   Take 1 tablet (25 mg total) by mouth daily.   30 tablet   2   . Insulin Glargine (LANTUS SOLOSTAR) 100 UNIT/ML SOPN   Subcutaneous   Inject 45 Units into the skin at bedtime.         . Insulin Pen Needle (SURE COMFORT PEN NEEDLES) 31G X 5 MM MISC      Use to inject insulin once a day.          . losartan (COZAAR) 50 MG tablet   Oral   Take 50 mg by mouth daily.         Marland Kitchen OVER THE  COUNTER MEDICATION   Oral   Take 2 tablets by mouth daily. "flexoprin"         . Oxymetazoline HCl (VICKS SINEX 12 HOUR NA)   Each Nare   Place 1 spray into both nostrils as needed (congestion).          BP 131/78  Pulse 68  Temp(Src) 97.9 F (36.6 C) (Oral)  Resp 18  Ht 5\' 4"  (1.626 m)  Wt 261 lb (118.389 kg)  BMI 44.78 kg/m2  SpO2 97% Physical Exam  Nursing note and vitals reviewed. Constitutional:  Awake, alert, nontoxic appearance.  HENT:  Head: Atraumatic.  Eyes: Right eye exhibits no discharge. Left eye exhibits no discharge.  Neck: Neck supple.  Cardiovascular: Normal rate and regular rhythm.   No murmur heard. Pulmonary/Chest: Effort normal and breath sounds normal. No respiratory distress. She has no wheezes. She has no rales. She exhibits tenderness.  reproducible right chest wall tenderness  Abdominal: Soft. There is no tenderness. There is no rebound.  Musculoskeletal: She exhibits no edema and no tenderness.  Baseline ROM, no obvious new focal weakness.  Neurological: She is alert.  Mental status and motor strength appears baseline for patient and situation.  Skin: No  rash noted.  Psychiatric: She has a normal mood and affect.    ED Course  Procedures (including critical care time) ECG: Normal sinus rhythm, ventricular rate 63, normal axis, nonspecific T wave abnormality, prolonged QT interval, no comparison ECG available  Patient / Family / Caregiver informed of clinical course, understand medical decision-making process, and agree with plan.  Labs Review Labs Reviewed  BASIC METABOLIC PANEL - Abnormal; Notable for the following:    Glucose, Bld 125 (*)    GFR calc non Af Amer 84 (*)    All other components within normal limits  CBC  POCT I-STAT TROPONIN I   Imaging Review Dg Chest 2 View  12/31/2012   CLINICAL DATA:  Chest pain. History of asthma.  EXAM: CHEST  2 VIEW  COMPARISON:  None.  FINDINGS: The heart is mildly enlarged. The mediastinal and hilar contours are within normal limits. There is peribronchial thickening and increased interstitial markings which could be related to reactive airways disease or bronchitis. No focal infiltrates, edema or effusions.  IMPRESSION: Bronchitic type lung changes acute versus chronic. No focal infiltrates.   Electronically Signed   By: Loralie Champagne M.D.   On: 12/31/2012 18:21    MDM   1. Chest pain     I doubt any other [facility] precluding discharge at this time including, but not necessarily limited to the following:ACS, PE.   Hurman Horn, MD 01/02/13 1745

## 2012-12-31 NOTE — ED Notes (Signed)
Pt presents with right arm pain that radiates to her chest- was seen by PCP for similar symptoms earlier this week and given medication.  Pt denies any current pain at this time.  Will continue to monitor pt.

## 2012-12-31 NOTE — ED Notes (Signed)
Pt reports her PCP saw her for CP last week and started on losartan, tylenol, antianxiety medication for pain. Today the pain returned and it hurts in chest, R arm, and back. L hand "feels numb and tingly." pt has felt sweaty, nauseated and SOB with the pain. A&ox4, resp e/u

## 2013-01-10 ENCOUNTER — Other Ambulatory Visit (INDEPENDENT_AMBULATORY_CARE_PROVIDER_SITE_OTHER): Payer: Self-pay

## 2013-01-10 ENCOUNTER — Ambulatory Visit (INDEPENDENT_AMBULATORY_CARE_PROVIDER_SITE_OTHER): Payer: BC Managed Care – PPO | Admitting: Surgery

## 2013-01-10 ENCOUNTER — Encounter (INDEPENDENT_AMBULATORY_CARE_PROVIDER_SITE_OTHER): Payer: Self-pay | Admitting: Surgery

## 2013-01-10 DIAGNOSIS — Z90722 Acquired absence of ovaries, bilateral: Secondary | ICD-10-CM | POA: Insufficient documentation

## 2013-01-10 LAB — CBC WITH DIFFERENTIAL/PLATELET
Basophils Absolute: 0 10*3/uL (ref 0.0–0.1)
Basophils Relative: 0 % (ref 0–1)
Eosinophils Absolute: 0.2 10*3/uL (ref 0.0–0.7)
Eosinophils Relative: 3 % (ref 0–5)
HCT: 38.9 % (ref 36.0–46.0)
Lymphocytes Relative: 45 % (ref 12–46)
Lymphs Abs: 3 10*3/uL (ref 0.7–4.0)
MCHC: 34.2 g/dL (ref 30.0–36.0)
MCV: 82.1 fL (ref 78.0–100.0)
Monocytes Absolute: 0.4 10*3/uL (ref 0.1–1.0)
Platelets: 291 10*3/uL (ref 150–400)
RBC: 4.74 MIL/uL (ref 3.87–5.11)
RDW: 15.8 % — ABNORMAL HIGH (ref 11.5–15.5)
WBC: 6.7 10*3/uL (ref 4.0–10.5)

## 2013-01-10 NOTE — Progress Notes (Signed)
Chief Complaint:  Morbid obesity BMI of 44  History of Present Illness:  Brenda Bowman is an 52 y.o. female who attended our seminar and is interested in a sleeve gastrectomy. She has diabetes mellitus and hypertension and has had lifelong obesity. She has tried numerous attempts at losing weight somewhat success followed by weight regain. She is pursuing this to try to help resolve her diabetes mellitus.  We discussed sleeve gastrectomy in some detail. She's where her of the risk indications for the procedure. She wants to try to get this on as soon as possible.  Past Medical History  Diagnosis Date  . Hypertension   . Diabetes mellitus   . HLD (hyperlipidemia)   . 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 Anxiety     Past Surgical History  Procedure Laterality Date  . Bilateral oophorectomy      Current Outpatient Prescriptions  Medication Sig Dispense Refill  . acetaminophen (TYLENOL) 500 MG tablet Take 1,000 mg by mouth every 6 (six) hours as needed for pain.      Marland Kitchen atenolol (TENORMIN) 25 MG tablet Take 25 mg by mouth 2 (two) times daily.      . diclofenac sodium (VOLTAREN) 1 % GEL Apply 2 g topically 2 (two) times daily as needed (pain).  100 g  1  . DM-Phenylephrine-Acetaminophen (ALKA-SELTZER PLS SINUS & COUGH) 10-5-325 MG CAPS Take 2 capsules by mouth as needed (cold).      Marland Kitchen escitalopram (LEXAPRO) 5 MG tablet Take 5 mg by mouth daily.      Marland Kitchen glipiZIDE (GLUCOTROL) 5 MG tablet Take 5 mg by mouth 2 (two) times daily before a meal.      . hydrochlorothiazide (HYDRODIURIL) 25 MG tablet Take 1 tablet (25 mg total) by mouth daily.  30 tablet  2  . Insulin Glargine (LANTUS SOLOSTAR) 100 UNIT/ML SOPN Inject 45 Units into the skin at bedtime.      . Insulin Pen Needle (SURE COMFORT PEN NEEDLES) 31G X 5 MM MISC Use to inject insulin once a day.       . losartan (COZAAR) 50 MG tablet Take  50 mg by mouth daily.      Marland Kitchen OVER THE COUNTER MEDICATION Take 2 tablets by mouth daily. "flexoprin"      . Oxymetazoline HCl (VICKS SINEX 12 HOUR NA) Place 1 spray into both nostrils as needed (congestion).       No current facility-administered medications for this visit.   Codeine; Metformin and related; and Morphine Family History  Problem Relation Age of Onset  . Stroke Neg Hx   . Miscarriages / Stillbirths Neg Hx   . Heart disease Neg Hx   . Cancer Neg Hx    Social History:   reports that she has never smoked. She has never used smokeless tobacco. She reports that she does not drink alcohol or use illicit drugs.   REVIEW OF SYSTEMS - PERTINENT POSITIVES ONLY: Negative for DVT no prior abdominal surgery except for a laparoscopic nephrectomy  Physical Exam:   Blood pressure 140/82, pulse 72, temperature 97.4 F (36.3 C), temperature source Temporal, resp. rate 16, height 5' 4.5" (1.638 m), weight 260 lb 6.4 oz (118.117 kg). Body mass index is 44.02 kg/(m^2).  Gen:  WDWN AAF NAD  Neurological: Alert and oriented to person, place, and time. Motor and sensory function is grossly intact  Head: Normocephalic  and atraumatic.  Eyes: Conjunctivae are normal. Pupils are equal, round, and reactive to light. No scleral icterus.  Neck: Normal range of motion. Neck supple. No tracheal deviation or thyromegaly present.  Cardiovascular:  SR without murmurs or gallops.  No carotid bruits Respiratory: Effort normal.  No respiratory distress. No chest wall tenderness. Breath sounds normal.  No wheezes, rales or rhonchi.  Abdomen:  nontender and obese GU: Musculoskeletal: Normal range of motion. Extremities are nontender. No cyanosis, edema or clubbing noted Lymphadenopathy: No cervical, preauricular, postauricular or axillary adenopathy is present Skin: Skin is warm and dry. No rash noted. No diaphoresis. No erythema. No pallor. Pscyh: Normal mood and affect. Behavior is normal. Judgment and  thought content normal.   LABORATORY RESULTS: No results found for this or any previous visit (from the past 48 hour(s)).  RADIOLOGY RESULTS: No results found.  Problem List: Patient Active Problem List   Diagnosis Date Noted  . History of removal of both ovaries 01/10/2013  . Routine health maintenance 02/08/2012  . Failure to attend appointment 01/17/2012  . Hyperlipidemia LDL goal < 100 08/31/2011  . Infertility, female 08/24/2011  . Severe obesity (BMI >= 40) 08/24/2011  . OVARIAN MASS 11/24/2009  . Hirsutism 08/06/2009  . Type II or unspecified type diabetes mellitus without mention of complication, not stated as uncontrolled 07/30/2009  . Unspecified essential hypertension 07/30/2009    Assessment & Plan: Morbid obesity with diabetes mellitus. We'll begin workup for laparoscopic sleeve gastrectomy.    Matt B. Daphine Deutscher, MD, [facility] [phone] beeper [phone]  01/10/2013 11:08 AM

## 2013-01-10 NOTE — Patient Instructions (Signed)

## 2013-01-11 LAB — TSH: TSH: 1.051 u[IU]/mL (ref 0.350–4.500)

## 2013-01-11 LAB — LIPID PANEL
LDL Cholesterol: 164 mg/dL — ABNORMAL HIGH (ref 0–99)
Total CHOL/HDL Ratio: 5.9 Ratio
VLDL: 45 mg/dL — ABNORMAL HIGH (ref 0–40)

## 2013-01-15 ENCOUNTER — Ambulatory Visit (HOSPITAL_COMMUNITY)
Admission: RE | Admit: 2013-01-15 | Discharge: 2013-01-15 | Disposition: A | Discharge status: Home / Self Care | Payer: BC Managed Care – PPO | Source: Ambulatory Visit | Attending: Surgery | Admitting: Surgery

## 2013-01-15 DIAGNOSIS — Z13820 Encounter for screening for osteoporosis: Secondary | ICD-10-CM | POA: Insufficient documentation

## 2013-01-15 DIAGNOSIS — Z1231 Encounter for screening mammogram for malignant neoplasm of breast: Secondary | ICD-10-CM | POA: Insufficient documentation

## 2013-01-15 DIAGNOSIS — E785 Hyperlipidemia, unspecified: Secondary | ICD-10-CM | POA: Insufficient documentation

## 2013-01-15 DIAGNOSIS — I10 Essential (primary) hypertension: Secondary | ICD-10-CM | POA: Insufficient documentation

## 2013-01-15 DIAGNOSIS — E119 Type 2 diabetes mellitus without complications: Secondary | ICD-10-CM | POA: Insufficient documentation

## 2013-01-15 DIAGNOSIS — K7689 Other specified diseases of liver: Secondary | ICD-10-CM | POA: Insufficient documentation

## 2013-01-15 DIAGNOSIS — I517 Cardiomegaly: Secondary | ICD-10-CM | POA: Insufficient documentation

## 2013-01-15 DIAGNOSIS — Z6841 Body Mass Index (BMI) 40.0 and over, adult: Secondary | ICD-10-CM | POA: Insufficient documentation

## 2013-01-22 ENCOUNTER — Encounter: Payer: Self-pay | Admitting: Family Medicine

## 2013-01-22 ENCOUNTER — Ambulatory Visit (INDEPENDENT_AMBULATORY_CARE_PROVIDER_SITE_OTHER): Payer: BC Managed Care – PPO | Admitting: Family Medicine

## 2013-01-22 DIAGNOSIS — E119 Type 2 diabetes mellitus without complications: Secondary | ICD-10-CM

## 2013-01-22 DIAGNOSIS — I10 Essential (primary) hypertension: Secondary | ICD-10-CM

## 2013-01-22 NOTE — Patient Instructions (Addendum)
Diet Recommendations for Diabetes   Starchy (carb) foods include: Bread, rice, pasta, potatoes, corn, crackers, bagels, muffins, all baked goods.  (Fruits, milk, and yogurt also have carbohydrate, but most of these foods will not spike your blood sugar as the starchy foods will.)  A few fruits do cause high blood sugars; use small portions of bananas (limit to 1/2 at a time), grapes (limit to a handful), watermelon (limit to one slice) and tropical fruits like pineapple, mango.  Eat your fruit instead of drinking it!  Protein foods include: Meat, fish, poultry, eggs, dairy foods, and beans such as pinto and kidney beans (beans also provide carbohydrate).   1. Eat at least 3 meals and 1-2 snacks per day. Never go more than 4-5 hours while awake without eating. It's important to have a consistent eating schedule.   - Eat breakfast within the first hour of getting up.  2. Limit starchy foods to TWO per meal and ONE per snack. ONE portion of a starchy  food is equal to the following:   - ONE slice of bread (or its equivalent, such as half of a hamburger bun).   - 1/2 cup of a "scoopable" starchy food such as potatoes or rice.   - 15 grams of carbohydrate as shown on food label.  3. Both lunch and dinner should include a protein food, a carb food, and vegetables.   - Obtain twice as many veg's as protein or carbohydrate foods for both lunch and dinner.   - Fresh or frozen veg's are best.   - More veg's will help you control your weight, your BP, and blood sugar.   - Try to keep frozen veg's on hand for a quick vegetable serving.    4. Breakfast should always include protein.    - Signs of hypoglycemia:  Light-headedness, dizzy, heart beating faster, sweating, hunger, nausea, fuzzy thinking, headache, fatigue.

## 2013-01-22 NOTE — Progress Notes (Signed)
Medical Nutrition Therapy:  Appt start time: 1000 end time:  1100.  Assessment:  Primary concerns today: Weight management and Blood sugar control.  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  She is considering gastric sleeve for weight loss, and would like to have the surgery before the end of the year.  She has completed the seminar, and wants to meet the requirement to meet with an RD pre-surgery.  Her insurance does not require 6 months of documentation and supervision by an RD/physician.  Usual eating pattern is erratic, ranging from eating once a day to multiple times.  Usual physical activity includes walking her 2 dogs 90 min to 2 hrs 5 X wk.  Occasionally, she uses her TM at home, usually 30 min 2 X wk.  She would like to get back to exercising at the [facility].  FBG have been 95-100; have improved since starting Glypizide and increasing Lantus from 15 to 45.   Frequent foods include boiled eggs, coffee w/ flavored creamer, 16 oz orange juice, fruit (banana & grapes), sandwich.  Avoided foods include beets, cranberries.    24-hr recall: (Up at 6 AM) B (6 AM)-   4 oz cran-grape juice Snk (8:30)-   2 chx legs w/ skin L (3 PM)-  1 c rice, 4 oz cube steak, 1 c gravy, onions Snk ( PM)-  none D (7 PM)-  2 chx legs w/ skin, 1 slc bread, diet soda Snk ( PM)-  none Yesterday was atypical.  Usual intake includes breakfast of 1 egg, coffee; lunch is usually school lunch salad or food from home; dinner is usually is sometimes fast food.    Progress Towards Goal(s):  In progress.   Nutritional Diagnosis:  NI-5.8.3 Inappropriate intake of types of carbohydrates (specify):   As related to inadequate fruit and vegetables.  As evidenced by no intake of fruit or veg yesterday.    Intervention:  Nutrition education.  Monitoring/Evaluation:  Dietary intake, exercise, BG, and body weight in 5 week(s).

## 2013-01-25 ENCOUNTER — Ambulatory Visit (HOSPITAL_COMMUNITY)
Admission: RE | Admit: 2013-01-25 | Discharge: 2013-01-25 | Disposition: A | Discharge status: Home / Self Care | Payer: BC Managed Care – PPO | Source: Ambulatory Visit | Attending: Surgery | Admitting: Surgery

## 2013-01-25 ENCOUNTER — Encounter (HOSPITAL_COMMUNITY)
Admission: RE | Disposition: A | Discharge status: Home / Self Care | Payer: Self-pay | Source: Ambulatory Visit | Attending: Surgery

## 2013-01-25 DIAGNOSIS — Z01818 Encounter for other preprocedural examination: Secondary | ICD-10-CM | POA: Insufficient documentation

## 2013-01-25 HISTORY — PX: BREATH TEK H PYLORI: SHX5422

## 2013-01-25 SURGERY — BREATH TEST, FOR HELICOBACTER PYLORI

## 2013-01-28 ENCOUNTER — Encounter (HOSPITAL_COMMUNITY): Payer: Self-pay | Admitting: Surgery

## 2013-01-28 ENCOUNTER — Encounter: Payer: Self-pay | Admitting: Family Medicine

## 2013-01-30 ENCOUNTER — Ambulatory Visit (HOSPITAL_COMMUNITY): Payer: BC Managed Care – PPO

## 2013-01-30 ENCOUNTER — Other Ambulatory Visit (HOSPITAL_COMMUNITY): Payer: BC Managed Care – PPO

## 2013-01-31 ENCOUNTER — Ambulatory Visit: Payer: BC Managed Care – PPO | Admitting: *Deleted

## 2013-02-05 ENCOUNTER — Ambulatory Visit: Payer: BC Managed Care – PPO | Admitting: Family Medicine

## 2013-02-07 ENCOUNTER — Other Ambulatory Visit: Payer: Self-pay

## 2013-02-13 NOTE — Progress Notes (Signed)
Dr  Daphine Deutscher-  When you can please op orders in [facility]-  Has PST appt  02/20/13  Thamks

## 2013-02-14 ENCOUNTER — Encounter: Payer: BC Managed Care – PPO | Attending: Surgery

## 2013-02-15 NOTE — Progress Notes (Signed)
Pre-Operative Nursing and Nutrition Class:  Appt start time: 1730   End time:  1930. Patient was seen on 02/14/13 for Pre-Operative Bariatric Surgery Education at [facility].  Surgery date: 03/05/13 Surgery type: Gastric Sleeve The following the learning objectives were met by the patient during this course: Identify Pre-Op Dietary Goals and will begin 2 weeks pre-operatively Identify appropriate sources of fluids and proteins   State protein recommendations and appropriate sources pre and post-operatively Identify Post-Operative Dietary Goals and will follow for 2 weeks post-operatively Identify appropriate multivitamin and calcium sources Describe the need for physical activity post-operatively and will follow MD recommendations State when to call healthcare provider regarding medication questions or post-operative complications Handouts given during class include: Pre-Op Bariatric Surgery Diet Handout Protein Shake Handout Post-Op Bariatric Surgery Nutrition Handout BELT Program Information Flyer Support Group Information Flyer [facility] Bariatric Supplements Price List Follow-Up Plan: Patient will follow-up at [facility] 2 weeks post operatively for diet advancement per MD. Patient Instructions Follow:  Pre-Op Diet per MD 2 weeks prior to surgery   Phase 2- Liquids (clear/full) 2 weeks after surgery   Vitamin/Mineral/Calcium guidelines for purchasing bariatric supplements   Exercise guidelines pre and post-op per MD Follow-up at [facility] in 2 weeks post-op for diet advancement. Contact [facility] at [phone] or Skip Estimable [phone] [email or web address] with any questions or concerns Follow-up and Disposition    Return in about 5 weeks (03/19/13) for 2 Week Post-Op Class.

## 2013-02-19 ENCOUNTER — Encounter (HOSPITAL_COMMUNITY): Payer: Self-pay | Admitting: Pharmacy Technician

## 2013-02-19 ENCOUNTER — Telehealth: Payer: Self-pay | Admitting: Dietician

## 2013-02-19 NOTE — Telephone Encounter (Signed)
Responded to patient who was concerned about losing too much weight on the Pre-Op Diet and falling below the guideline for surgery.

## 2013-02-20 ENCOUNTER — Encounter (HOSPITAL_COMMUNITY)
Admission: RE | Admit: 2013-02-20 | Discharge: 2013-02-20 | Disposition: A | Discharge status: Home / Self Care | Payer: BC Managed Care – PPO | Source: Ambulatory Visit | Attending: Surgery | Admitting: Surgery

## 2013-02-20 ENCOUNTER — Encounter (HOSPITAL_COMMUNITY): Payer: Self-pay

## 2013-02-20 DIAGNOSIS — Z01812 Encounter for preprocedural laboratory examination: Secondary | ICD-10-CM | POA: Insufficient documentation

## 2013-02-20 DIAGNOSIS — Z01818 Encounter for other preprocedural examination: Secondary | ICD-10-CM | POA: Insufficient documentation

## 2013-02-20 DIAGNOSIS — H409 Unspecified glaucoma: Secondary | ICD-10-CM

## 2013-02-20 DIAGNOSIS — B54 Unspecified malaria: Secondary | ICD-10-CM

## 2013-02-20 HISTORY — DX: Unspecified glaucoma: H40.9

## 2013-02-20 HISTORY — DX: Major depressive disorder, single episode, unspecified: F32.9

## 2013-02-20 HISTORY — DX: Unspecified malaria: B54

## 2013-02-20 HISTORY — DX: Depression, unspecified: F32.A

## 2013-02-20 LAB — CBC
Hemoglobin: 14.3 g/dL (ref 12.0–15.0)
MCH: 28.4 pg (ref 26.0–34.0)
MCHC: 33.3 g/dL (ref 30.0–36.0)
Platelets: 308 10*3/uL (ref 150–400)
RDW: 14.3 % (ref 11.5–15.5)

## 2013-02-20 LAB — COMPREHENSIVE METABOLIC PANEL
ALT: 16 U/L (ref 0–35)
AST: 17 U/L (ref 0–37)
Albumin: 4 g/dL (ref 3.5–5.2)
Calcium: 10.4 mg/dL (ref 8.4–10.5)
GFR calc Af Amer: >90 mL/min (ref >90)
Glucose, Bld: 104 mg/dL — ABNORMAL HIGH (ref 70–99)
Sodium: 138 mEq/L (ref 135–145)
Total Protein: 8.1 g/dL (ref 6.0–8.3)

## 2013-02-20 NOTE — Pre-Procedure Instructions (Signed)
02-20-13 EKG 9'14/ CXR 10 '14 -Epic

## 2013-02-20 NOTE — Patient Instructions (Addendum)
20 [name]  02/20/2013   Your procedure is scheduled on: 12-2  -2014  Report to [facility] at   0530     AM .  Call this number if you have problems the morning of surgery: [phone]  Or Presurgical Testing [phone](Aliyha Fornes)   Remember: Follow any bowel prep instructions per MD office.   Do not eat food:After Midnight.    Take these medicines the morning of surgery with A SIP OF WATER: Atenolol. Latuda.  Bring nasal spray, Vick's inhaler. Use Lantus(1/2 usual PM dose) night before. Do not use any Diabetic meds AM of.   Do not wear jewelry, make-up or nail polish.  Do not wear lotions, powders, or perfumes. You may wear deodorant.  Do not shave 12 hours prior to first CHG shower(legs and under arms).(face and neck okay.)  Do not bring valuables to the hospital.  Contacts, dentures or removable bridgework, body piercing, hair pins may not be worn into surgery.  Leave suitcase in the car. After surgery it may be brought to your room.  For patients admitted to the hospital, checkout time is 11:00 AM the day of discharge.   Patients discharged the day of surgery will not be allowed to drive home. Must have responsible person with you x 24 hours once discharged.  Name and phone number of your driver: Gladstone Lighter [phone]   Special Instructions: CHG(Chlorhedine 4%-"Hibiclens","Betasept","Aplicare") Shower Use Special Wash: see special instructions.(avoid face and genitals)     Failure to follow these instructions may result in Cancellation of your surgery.   Patient signature_______________________________________________________

## 2013-02-22 ENCOUNTER — Encounter (INDEPENDENT_AMBULATORY_CARE_PROVIDER_SITE_OTHER): Payer: Self-pay | Admitting: Surgery

## 2013-02-22 ENCOUNTER — Encounter (INDEPENDENT_AMBULATORY_CARE_PROVIDER_SITE_OTHER): Payer: Self-pay

## 2013-02-22 ENCOUNTER — Ambulatory Visit (INDEPENDENT_AMBULATORY_CARE_PROVIDER_SITE_OTHER): Payer: BC Managed Care – PPO | Admitting: Surgery

## 2013-02-22 NOTE — Progress Notes (Signed)
Chief Complaint:  For sleeve gastrectomy on December 2  History of Present Illness:  Brenda Bowman is an 52 y.o. female who has completed the workup for sleeve gastrectomy.  Her upper GI series was negative for a hiatal hernia. Her ultrasound showed no evidence of gallstones. She has completed her review of the consent document will sign one here. She did not have any further questions regarding the sleeve gastrectomy. We'll proceed with sleeve gastrectomy and December 2.  Past Medical History  Diagnosis Date  . Hypertension   . HLD (hyperlipidemia)   . Ovarian mass 11/2009    per MRI report 11/05/2009 - bilateral fibroadenomas wv Darnelle Bos tumor; patient is following at [facility]  . Anxiety   . Depression   . Malaria 02-20-13    1'90-[place] was tx.  . Diabetes mellitus     oral  and Lantus  . Glaucoma 02-20-13    left eye -eye drop used    Past Surgical History  Procedure Laterality Date  . Bilateral oophorectomy    . Breath tek h pylori N/A 01/25/2013    Procedure: BREATH TEK H PYLORI;  Surgeon: Valarie Merino, MD;  Location: [facility];  Service: General;  Laterality: N/A;  . Dilation and curettage of uterus      Current Outpatient Prescriptions  Medication Sig Dispense Refill  . acetaminophen (TYLENOL) 500 MG tablet Take 1,000 mg by mouth every 6 (six) hours as needed for pain.      Marland Kitchen atenolol (TENORMIN) 50 MG tablet Take 50 mg by mouth 2 (two) times daily.      . diclofenac sodium (VOLTAREN) 1 % GEL Apply 2 g topically 2 (two) times daily as needed (pain).  100 g  1  . DM-Phenylephrine-Acetaminophen (ALKA-SELTZER PLS SINUS & COUGH) 10-5-325 MG CAPS Take 2 capsules by mouth as needed (cold).      Marland Kitchen glipiZIDE (GLUCOTROL) 5 MG tablet Take 5 mg by mouth 2 (two) times daily before a meal.      . hydrochlorothiazide (HYDRODIURIL) 25 MG tablet Take 25 mg by mouth every morning.      . Insulin Glargine (LANTUS SOLOSTAR) 100 UNIT/ML SOPN Inject 45 Units into the skin  at bedtime.      Marland Kitchen losartan (COZAAR) 50 MG tablet Take 50 mg by mouth every morning.       . lurasidone (LATUDA) 40 MG TABS tablet Take 40 mg by mouth daily with breakfast.      . Oxymetazoline HCl (VICKS SINEX 12 HOUR NA) Place 1 spray into both nostrils as needed (congestion).      . vitamin B-12 (CYANOCOBALAMIN) 1000 MCG tablet Take 1,000 mcg by mouth 2 (two) times daily.       No current facility-administered medications for this visit.   Codeine; Metformin and related; and Morphine Family History  Problem Relation Age of Onset  . Stroke Neg Hx   . Miscarriages / Stillbirths Neg Hx   . Heart disease Neg Hx   . Cancer Neg Hx    Social History:   reports that she has never smoked. She has never used smokeless tobacco. She reports that she does not drink alcohol or use illicit drugs.   REVIEW OF SYSTEMS - PERTINENT POSITIVES ONLY: negative  Physical Exam:   Blood pressure 128/78, pulse 72, temperature 98.2 F (36.8 C), temperature source Temporal, resp. rate 14, height 5' 4.5" (1.638 m), weight 259 lb 6.4 oz (117.663 kg), last menstrual period 01/22/2012. Body mass index  is 43.85 kg/(m^2).  Gen:  WDWN African American female NAD  Neurological: Alert and oriented to person, place, and time. Motor and sensory function is grossly intact  Head: Normocephalic and atraumatic.  Eyes: Conjunctivae are normal. Pupils are equal, round, and reactive to light. No scleral icterus.  Neck: Normal range of motion. Neck supple. No tracheal deviation or thyromegaly present.  Cardiovascular:  SR without murmurs or gallops.  No carotid bruits Respiratory: Effort normal.  No respiratory distress. No chest wall tenderness. Breath sounds normal.  No wheezes, rales or rhonchi.  Abdomen:  nontender and obese GU: Musculoskeletal: Normal range of motion. Extremities are nontender. No cyanosis, edema or clubbing noted Lymphadenopathy: No cervical, preauricular, postauricular or axillary adenopathy is  present Skin: Skin is warm and dry. No rash noted. No diaphoresis. No erythema. No pallor. Pscyh: Normal mood and affect. Behavior is normal. Judgment and thought content normal.   LABORATORY RESULTS: Results for orders placed during the hospital encounter of 02/20/13 (from the past 48 hour(s))  CBC     Status: None   Collection Time    02/20/13  3:35 PM      Result Value Range   WBC 9.9  4.0 - 10.5 K/uL   RBC 5.04  3.87 - 5.11 MIL/uL   Hemoglobin 14.3  12.0 - 15.0 g/dL   HCT 62.1  30.8 - 65.7 %   MCV 85.1  78.0 - 100.0 fL   MCH 28.4  26.0 - 34.0 pg   MCHC 33.3  30.0 - 36.0 g/dL   RDW 84.6  96.2 - 95.2 %   Platelets 308  150 - 400 K/uL  COMPREHENSIVE METABOLIC PANEL     Status: Abnormal   Collection Time    02/20/13  3:35 PM      Result Value Range   Sodium 138  135 - 145 mEq/L   Potassium 3.6  3.5 - 5.1 mEq/L   Chloride 98  96 - 112 mEq/L   CO2 30  19 - 32 mEq/L   Glucose, Bld 104 (*) 70 - 99 mg/dL   BUN 27 (*) 6 - 23 mg/dL   Creatinine, Ser 8.41  0.50 - 1.10 mg/dL   Calcium 32.4  8.4 - 40.1 mg/dL   Total Protein 8.1  6.0 - 8.3 g/dL   Albumin 4.0  3.5 - 5.2 g/dL   AST 17  0 - 37 U/L   ALT 16  0 - 35 U/L   Alkaline Phosphatase 61  39 - 117 U/L   Total Bilirubin 0.2 (*) 0.3 - 1.2 mg/dL   GFR calc non Af Amer 80 (*) >90 mL/min   GFR calc Af Amer >90  >90 mL/min   Comment: (NOTE)     The eGFR has been calculated using the CKD EPI equation.     This calculation has not been validated in all clinical situations.     eGFR's persistently <90 mL/min signify possible Chronic Kidney     Disease.    RADIOLOGY RESULTS: No results found.  Problem List: Patient Active Problem List   Diagnosis Date Noted  . History of removal of both ovaries 01/10/2013  . Routine health maintenance 02/08/2012  . Failure to attend appointment 01/17/2012  . Hyperlipidemia LDL goal < 100 08/31/2011  . Infertility, female 08/24/2011  . Severe obesity (BMI >= 40) 08/24/2011  . OVARIAN MASS  11/24/2009  . Hirsutism 08/06/2009  . Type II or unspecified type diabetes mellitus without mention of complication, not stated  as uncontrolled 07/30/2009  . Unspecified essential hypertension 07/30/2009    Assessment & Plan: Morbid obesity BMI is 43.8 Plan sleeve gastrectomy    Matt B. Daphine Deutscher, MD, [facility] [phone] beeper [phone]  02/22/2013 3:30 PM

## 2013-02-22 NOTE — Patient Instructions (Addendum)
Sleeve Gastrectomy A sleeve gastrectomy is a surgery in which a large portion of the stomach is removed. After the surgery, the stomach will be a narrow tube about the size of a banana. This surgery is performed to help a person lose weight. The person loses weight because the reduced size of the stomach restricts the amount of food that the person can eat. The stomach will hold much less food than before the surgery. Also, the part of the stomach that is removed produces a hormone that causes hunger.  This surgery is done for people who have morbid obesity, defined as a body mass index (BMI) greater than 40. BMI is an estimate of body fat and is calculated from the height and weight of a person. This surgery may also be done for people with a BMI between 35 and 40 if they have other diseases, such as type 2 diabetes mellitus, obstructive sleep apnea, or heart and lung disorders (cardiopulmonary diseases).  LET YOUR HEALTH CARE PROVIDER KNOW ABOUT:  Any allergies you have.   All medicines you are taking, including vitamins, herbs, eyedrops, creams, and over-the-counter medicines.   Use of steroids (by mouth or creams).   Previous problems you or members of your family have had with the use of anesthetics.   Any blood disorders you have.   Previous surgeries you have had.   Possibility of pregnancy, if this applies.   Other health problems you have. RISKS AND COMPLICATIONS Generally, sleeve gastrectomy is a safe procedure. However, as with any procedure, complications can occur. Possible complications include:  Infection.  Bleeding.  Blood clots.  Damage to other organs or tissue.  Leakage of fluid from the stomach into the abdominal cavity (rare). BEFORE THE PROCEDURE  You may need to have blood tests and imaging tests (such as X-rays or ultrasonography) done before the day of surgery. A test to evaluate your esophagus and how it moves (esophageal manometry) may also be  done.  You may be placed on a liquid diet 2 3 weeks before the surgery.  Ask your health care provider about changing or stopping your regular medicines.  Do not eat or drink anything for at least 8 hours before the procedure.   Make plans to have someone drive you home after your hospital stay. Also arrange for someone to help you with activities during recovery. PROCEDURE  A laparoscopic technique is usually used for this surgery:  You will be given medicine to make you sleep through the procedure (general anesthetic). This medicine will be given through an intravenous (IV) access tube that is put into one of your veins.  Once you are asleep, your abdomen will be cleaned and sterilized.  Several small incisions will be made in your abdomen.  Your abdomen will be filled with air so that it expands. This gives the surgeon more room to operate and makes your organs easier to see.  A thin, lighted tube with a tiny camera on the end (laparoscope) is put through a small incision in your abdomen. The camera on the laparoscope sends a picture to a TV screen in the operating room. This gives the surgeon a good view inside the abdomen.  Hollow tubes are put through the other small incisions in your abdomen. The tools needed for the procedure are put through these tubes.  The surgeon uses staples to divide part of the stomach and then removes it through one of the incisions.  The remaining stomach may be reinforced using   stitches or surgical glue or both to prevent leakage of the stomach contents. A small tube (drain) may be placed through one of the incisions to allow extra fluid to flow from the area.  The incisions are closed with stitches, staples, or glue. AFTER THE PROCEDURE  You will be monitored closely in a recovery area. Once the anesthetic has worn off, you will likely be moved to a regular hospital room.  You will be given medicine for pain and nausea.   You may have a drain  from one of the incisions in your abdomen. If a drain is used, it may stay in place after you go home from the hospital and be removed at a follow-up appointment.   You will be encouraged to walk around several times a day. This helps prevent blood clots.  You will be started on a liquid diet the first day after your surgery. Sometimes a test is done to check for leaking before you can eat.  You will be urged to cough and do deep breathing exercises. This helps prevent a lung infection after a surgery.  You will likely need to stay in the hospital for a few days.  Document Released: 01/16/2009 Document Revised: 11/21/2012 Document Reviewed: 08/03/2012 [employer] Patient Information 2014 [employer].  Two weeks prior to surgery  Go on the extremely low carb liquid diet One week prior to surgery  No aspirin products.  Tylenol is acceptable  Stop smoking 24 hours prior to surgery  No alcoholic beverages  Report fever greater than 100.5 or excessive nasal drainage suggesting infection  Continue bariatric preop diet  Perform bowel prep if ordered  Do not eat or drink anything after midnight the night before surgery  Do not take any medications except those instructed by the anesthesiologist Morning of surgery  Please arrive at the hospital at least 2 hours before your scheduled surgery time.  No makeup, fingernail polish or jewelry  Bring insurance cards with you  Bring your CPAP mask if you use this   

## 2013-02-25 ENCOUNTER — Ambulatory Visit: Payer: BC Managed Care – PPO | Admitting: Family Medicine

## 2013-03-04 ENCOUNTER — Other Ambulatory Visit (INDEPENDENT_AMBULATORY_CARE_PROVIDER_SITE_OTHER): Payer: Self-pay | Admitting: Surgery

## 2013-03-04 NOTE — Anesthesia Preprocedure Evaluation (Addendum)
Anesthesia Evaluation  Patient identified by MRN, date of birth, ID band Patient awake    Reviewed: Allergy & Precautions, H&P , NPO status , Patient's Chart, lab work & pertinent test results  Airway Mallampati: III TM Distance: >3 FB Neck ROM: Limited  Mouth opening: Limited Mouth Opening  Dental  (+) Teeth Intact and Dental Advisory Given   Pulmonary neg pulmonary ROS,  breath sounds clear to auscultation  Pulmonary exam normal       Cardiovascular hypertension, Pt. on medications Rhythm:Regular Rate:Normal     Neuro/Psych Anxiety Depression negative neurological ROS  negative psych ROS   GI/Hepatic negative GI ROS, Neg liver ROS,   Endo/Other  diabetes, Type 1, Oral Hypoglycemic Agents and Insulin DependentMorbid obesity  Renal/GU negative Renal ROS  negative genitourinary   Musculoskeletal negative musculoskeletal ROS (+)   Abdominal (+) + obese,   Peds  Hematology negative hematology ROS (+)   Anesthesia Other Findings Large tongue with narrow oral radius  Reproductive/Obstetrics negative OB ROS                         Anesthesia Physical Anesthesia Plan  ASA: III  Anesthesia Plan: General   Post-op Pain Management:    Induction: Intravenous  Airway Management Planned: Oral ETT  Additional Equipment:   Intra-op Plan:   Post-operative Plan: Extubation in OR  Informed Consent: I have reviewed the patients History and Physical, chart, labs and discussed the procedure including the risks, benefits and alternatives for the proposed anesthesia with the patient or authorized representative who has indicated his/her understanding and acceptance.   Dental advisory given  Plan Discussed with: CRNA  Anesthesia Plan Comments:         Anesthesia Quick Evaluation

## 2013-03-05 ENCOUNTER — Inpatient Hospital Stay (HOSPITAL_COMMUNITY)
Admission: RE | Admit: 2013-03-05 | Discharge: 2013-03-08 | DRG: 621 | Disposition: A | Discharge status: Home / Self Care | Payer: BC Managed Care – PPO | Source: Ambulatory Visit | Attending: Surgery | Admitting: Surgery

## 2013-03-05 ENCOUNTER — Encounter (HOSPITAL_COMMUNITY): Payer: Self-pay | Admitting: *Deleted

## 2013-03-05 ENCOUNTER — Inpatient Hospital Stay (HOSPITAL_COMMUNITY): Payer: BC Managed Care – PPO | Admitting: Anesthesiology

## 2013-03-05 ENCOUNTER — Encounter (HOSPITAL_COMMUNITY)
Admission: RE | Disposition: A | Discharge status: Home / Self Care | Payer: Self-pay | Source: Ambulatory Visit | Attending: Surgery

## 2013-03-05 ENCOUNTER — Encounter (HOSPITAL_COMMUNITY): Payer: BC Managed Care – PPO | Admitting: Anesthesiology

## 2013-03-05 DIAGNOSIS — Z6841 Body Mass Index (BMI) 40.0 and over, adult: Secondary | ICD-10-CM

## 2013-03-05 DIAGNOSIS — F329 Major depressive disorder, single episode, unspecified: Secondary | ICD-10-CM | POA: Diagnosis present

## 2013-03-05 DIAGNOSIS — F411 Generalized anxiety disorder: Secondary | ICD-10-CM | POA: Diagnosis present

## 2013-03-05 DIAGNOSIS — E119 Type 2 diabetes mellitus without complications: Secondary | ICD-10-CM | POA: Diagnosis present

## 2013-03-05 DIAGNOSIS — Z8613 Personal history of malaria: Secondary | ICD-10-CM

## 2013-03-05 DIAGNOSIS — Z794 Long term (current) use of insulin: Secondary | ICD-10-CM

## 2013-03-05 DIAGNOSIS — E785 Hyperlipidemia, unspecified: Secondary | ICD-10-CM

## 2013-03-05 DIAGNOSIS — Z9884 Bariatric surgery status: Secondary | ICD-10-CM

## 2013-03-05 DIAGNOSIS — Z01812 Encounter for preprocedural laboratory examination: Secondary | ICD-10-CM

## 2013-03-05 DIAGNOSIS — H409 Unspecified glaucoma: Secondary | ICD-10-CM | POA: Diagnosis present

## 2013-03-05 DIAGNOSIS — I10 Essential (primary) hypertension: Secondary | ICD-10-CM | POA: Diagnosis present

## 2013-03-05 DIAGNOSIS — F3289 Other specified depressive episodes: Secondary | ICD-10-CM | POA: Diagnosis present

## 2013-03-05 HISTORY — PX: LAPAROSCOPIC GASTRIC SLEEVE RESECTION: SHX5895

## 2013-03-05 LAB — CREATININE, SERUM
Creatinine, Ser: 0.92 mg/dL (ref 0.50–1.10)
GFR calc Af Amer: 82 mL/min — ABNORMAL LOW (ref >90)
GFR calc non Af Amer: 70 mL/min — ABNORMAL LOW (ref >90)

## 2013-03-05 LAB — CBC
HCT: 39.3 % (ref 36.0–46.0)
Hemoglobin: 13.1 g/dL (ref 12.0–15.0)
MCV: 86.4 fL (ref 78.0–100.0)
RBC: 4.55 MIL/uL (ref 3.87–5.11)
WBC: 14.5 10*3/uL — ABNORMAL HIGH (ref 4.0–10.5)

## 2013-03-05 LAB — GLUCOSE, CAPILLARY
Glucose-Capillary: 106 mg/dL — ABNORMAL HIGH (ref 70–99)
Glucose-Capillary: 148 mg/dL — ABNORMAL HIGH (ref 70–99)
Glucose-Capillary: 224 mg/dL — ABNORMAL HIGH (ref 70–99)

## 2013-03-05 LAB — HEMOGLOBIN A1C
Hgb A1c MFr Bld: 8.6 % — ABNORMAL HIGH (ref <5.7)
Mean Plasma Glucose: 200 mg/dL — ABNORMAL HIGH (ref <117)

## 2013-03-05 SURGERY — GASTRECTOMY, SLEEVE, LAPAROSCOPIC
Anesthesia: General | Site: Abdomen

## 2013-03-05 MED ORDER — ROCURONIUM BROMIDE 100 MG/10ML IV SOLN
INTRAVENOUS | Status: DC | PRN
  Administered 2013-03-05 (×3): 10 mg via INTRAVENOUS
  Administered 2013-03-05 (×2): 5 mg via INTRAVENOUS
  Administered 2013-03-05: 50 mg via INTRAVENOUS
  Administered 2013-03-05 (×2): 5 mg via INTRAVENOUS

## 2013-03-05 MED ORDER — FENTANYL CITRATE 0.05 MG/ML IJ SOLN
INTRAMUSCULAR | Status: AC
  Filled 2013-03-05: qty 5

## 2013-03-05 MED ORDER — UNJURY CHICKEN SOUP POWDER
2.0000 [oz_av] | Freq: Four times a day (QID) | ORAL | Status: DC
  Administered 2013-03-07 (×3): 2 [oz_av] via ORAL

## 2013-03-05 MED ORDER — DIPHENHYDRAMINE HCL 50 MG/ML IJ SOLN
12.5000 mg | Freq: Once | INTRAMUSCULAR | Status: AC
  Administered 2013-03-05: 12.5 mg via INTRAVENOUS
  Filled 2013-03-05: qty 1

## 2013-03-05 MED ORDER — MIDAZOLAM HCL 5 MG/5ML IJ SOLN
INTRAMUSCULAR | Status: DC | PRN
  Administered 2013-03-05: 2 mg via INTRAVENOUS

## 2013-03-05 MED ORDER — BUPIVACAINE-EPINEPHRINE PF 0.25-1:200000 % IJ SOLN
INTRAMUSCULAR | Status: AC
  Filled 2013-03-05: qty 30

## 2013-03-05 MED ORDER — POTASSIUM CHLORIDE IN NACL 20-0.9 MEQ/L-% IV SOLN
INTRAVENOUS | Status: DC
  Administered 2013-03-05 – 2013-03-08 (×7): via INTRAVENOUS
  Filled 2013-03-05 (×10): qty 1000

## 2013-03-05 MED ORDER — LIDOCAINE HCL (CARDIAC) 20 MG/ML IV SOLN
INTRAVENOUS | Status: AC
  Filled 2013-03-05: qty 5

## 2013-03-05 MED ORDER — DEXTROSE 5 % IV SOLN
2.0000 g | INTRAVENOUS | Status: AC
  Administered 2013-03-05: 2 g via INTRAVENOUS
  Filled 2013-03-05: qty 2

## 2013-03-05 MED ORDER — LACTATED RINGERS IR SOLN
Status: DC | PRN
  Administered 2013-03-05: 1

## 2013-03-05 MED ORDER — INSULIN ASPART 100 UNIT/ML ~~LOC~~ SOLN
0.0000 [IU] | SUBCUTANEOUS | Status: DC
  Administered 2013-03-05: 3 [IU] via SUBCUTANEOUS
  Administered 2013-03-05: 7 [IU] via SUBCUTANEOUS
  Administered 2013-03-06 – 2013-03-08 (×5): 3 [IU] via SUBCUTANEOUS
  Administered 2013-03-08: 4 [IU] via SUBCUTANEOUS

## 2013-03-05 MED ORDER — TISSEEL VH 10 ML EX KIT
PACK | CUTANEOUS | Status: DC | PRN
  Administered 2013-03-05: 10 mL

## 2013-03-05 MED ORDER — LACTATED RINGERS IV SOLN: Freq: Once | INTRAVENOUS | Status: DC

## 2013-03-05 MED ORDER — 0.9 % SODIUM CHLORIDE (POUR BTL) OPTIME
TOPICAL | Status: DC | PRN
  Administered 2013-03-05: 1000 mL

## 2013-03-05 MED ORDER — GLYCOPYRROLATE 0.2 MG/ML IJ SOLN
INTRAMUSCULAR | Status: DC | PRN
  Administered 2013-03-05: .8 mg via INTRAVENOUS

## 2013-03-05 MED ORDER — MIDAZOLAM HCL 2 MG/2ML IJ SOLN
INTRAMUSCULAR | Status: AC
  Filled 2013-03-05: qty 2

## 2013-03-05 MED ORDER — SODIUM CHLORIDE 0.9 % IJ SOLN
INTRAMUSCULAR | Status: AC
  Filled 2013-03-05: qty 50

## 2013-03-05 MED ORDER — HYDROMORPHONE HCL PF 1 MG/ML IJ SOLN
0.5000 mg | INTRAMUSCULAR | Status: DC | PRN
  Administered 2013-03-06 – 2013-03-07 (×3): 1 mg via INTRAVENOUS
  Filled 2013-03-05 (×3): qty 1

## 2013-03-05 MED ORDER — DEXTROSE 5 % IV SOLN
INTRAVENOUS | Status: AC
  Filled 2013-03-05 (×2): qty 1

## 2013-03-05 MED ORDER — UNJURY VANILLA POWDER
2.0000 [oz_av] | Freq: Four times a day (QID) | ORAL | Status: DC
  Administered 2013-03-07: 2 [oz_av] via ORAL

## 2013-03-05 MED ORDER — ONDANSETRON HCL 4 MG/2ML IJ SOLN
INTRAMUSCULAR | Status: AC
  Filled 2013-03-05: qty 2

## 2013-03-05 MED ORDER — EPHEDRINE SULFATE 50 MG/ML IJ SOLN
INTRAMUSCULAR | Status: DC | PRN
  Administered 2013-03-05 (×2): 10 mg via INTRAVENOUS

## 2013-03-05 MED ORDER — HYDROMORPHONE HCL PF 1 MG/ML IJ SOLN
0.2500 mg | INTRAMUSCULAR | Status: DC | PRN
  Administered 2013-03-05 (×2): 0.5 mg via INTRAVENOUS

## 2013-03-05 MED ORDER — NEOSTIGMINE METHYLSULFATE 1 MG/ML IJ SOLN
INTRAMUSCULAR | Status: AC
  Filled 2013-03-05: qty 10

## 2013-03-05 MED ORDER — BUPIVACAINE LIPOSOME 1.3 % IJ SUSP
20.0000 mL | Freq: Once | INTRAMUSCULAR | Status: AC
  Administered 2013-03-05: 20 mL
  Filled 2013-03-05: qty 20

## 2013-03-05 MED ORDER — ACETAMINOPHEN 160 MG/5ML PO SOLN
650.0000 mg | ORAL | Status: DC | PRN
  Administered 2013-03-07: 650 mg via ORAL
  Filled 2013-03-05: qty 20.3

## 2013-03-05 MED ORDER — ROCURONIUM BROMIDE 100 MG/10ML IV SOLN
INTRAVENOUS | Status: AC
  Filled 2013-03-05: qty 1

## 2013-03-05 MED ORDER — LACTATED RINGERS IV SOLN
INTRAVENOUS | Status: DC | PRN
  Administered 2013-03-05 (×3): via INTRAVENOUS

## 2013-03-05 MED ORDER — FENTANYL CITRATE 0.05 MG/ML IJ SOLN
INTRAMUSCULAR | Status: DC | PRN
  Administered 2013-03-05 (×2): 50 ug via INTRAVENOUS
  Administered 2013-03-05: 100 ug via INTRAVENOUS

## 2013-03-05 MED ORDER — HYDROMORPHONE HCL PF 1 MG/ML IJ SOLN
INTRAMUSCULAR | Status: AC
  Filled 2013-03-05: qty 1

## 2013-03-05 MED ORDER — SUCCINYLCHOLINE CHLORIDE 20 MG/ML IJ SOLN
INTRAMUSCULAR | Status: AC
  Filled 2013-03-05: qty 1

## 2013-03-05 MED ORDER — LACTATED RINGERS IV SOLN: INTRAVENOUS | Status: DC

## 2013-03-05 MED ORDER — TISSEEL VH 10 ML EX KIT
PACK | CUTANEOUS | Status: AC
  Filled 2013-03-05: qty 2

## 2013-03-05 MED ORDER — HEPARIN SODIUM (PORCINE) 5000 UNIT/ML IJ SOLN
5000.0000 [IU] | INTRAMUSCULAR | Status: AC
  Administered 2013-03-05: 5000 [IU] via SUBCUTANEOUS
  Filled 2013-03-05: qty 1

## 2013-03-05 MED ORDER — UNJURY CHOCOLATE CLASSIC POWDER: 2.0000 [oz_av] | Freq: Four times a day (QID) | ORAL | Status: DC

## 2013-03-05 MED ORDER — PROPOFOL 10 MG/ML IV BOLUS
INTRAVENOUS | Status: DC | PRN
  Administered 2013-03-05: 180 mg via INTRAVENOUS

## 2013-03-05 MED ORDER — ONDANSETRON HCL 4 MG/2ML IJ SOLN
4.0000 mg | INTRAMUSCULAR | Status: DC | PRN
  Filled 2013-03-05: qty 2

## 2013-03-05 MED ORDER — NEOSTIGMINE METHYLSULFATE 1 MG/ML IJ SOLN
INTRAMUSCULAR | Status: DC | PRN
  Administered 2013-03-05: 5 mg via INTRAVENOUS

## 2013-03-05 MED ORDER — HEPARIN SODIUM (PORCINE) 5000 UNIT/ML IJ SOLN
5000.0000 [IU] | Freq: Three times a day (TID) | INTRAMUSCULAR | Status: DC
  Administered 2013-03-05 – 2013-03-08 (×8): 5000 [IU] via SUBCUTANEOUS
  Filled 2013-03-05 (×11): qty 1

## 2013-03-05 MED ORDER — SUCCINYLCHOLINE CHLORIDE 20 MG/ML IJ SOLN
INTRAMUSCULAR | Status: DC | PRN
  Administered 2013-03-05: 160 mg via INTRAVENOUS

## 2013-03-05 MED ORDER — INSULIN GLARGINE 100 UNIT/ML ~~LOC~~ SOLN
10.0000 [IU] | Freq: Every day | SUBCUTANEOUS | Status: DC
  Administered 2013-03-05 – 2013-03-07 (×3): 10 [IU] via SUBCUTANEOUS
  Filled 2013-03-05 (×3): qty 0.1

## 2013-03-05 MED ORDER — PROMETHAZINE HCL 25 MG/ML IJ SOLN: 6.2500 mg | INTRAMUSCULAR | Status: DC | PRN

## 2013-03-05 MED ORDER — PROPOFOL 10 MG/ML IV BOLUS
INTRAVENOUS | Status: AC
  Filled 2013-03-05: qty 20

## 2013-03-05 MED ORDER — LIDOCAINE HCL (CARDIAC) 20 MG/ML IV SOLN
INTRAVENOUS | Status: DC | PRN
  Administered 2013-03-05: 100 mg via INTRAVENOUS

## 2013-03-05 MED ORDER — SODIUM CHLORIDE 0.9 % IJ SOLN
INTRAMUSCULAR | Status: AC
  Filled 2013-03-05: qty 10

## 2013-03-05 MED ORDER — GLYCOPYRROLATE 0.2 MG/ML IJ SOLN
INTRAMUSCULAR | Status: AC
  Filled 2013-03-05: qty 3

## 2013-03-05 MED ORDER — MORPHINE SULFATE 2 MG/ML IJ SOLN
2.0000 mg | INTRAMUSCULAR | Status: DC | PRN
  Administered 2013-03-05 – 2013-03-06 (×2): 2 mg via INTRAVENOUS
  Filled 2013-03-05 (×2): qty 1

## 2013-03-05 MED ORDER — EPHEDRINE SULFATE 50 MG/ML IJ SOLN
INTRAMUSCULAR | Status: AC
  Filled 2013-03-05: qty 1

## 2013-03-05 SURGICAL SUPPLY — 55 items
APPLICATOR COTTON TIP 6IN STRL (MISCELLANEOUS) IMPLANT
APPLIER CLIP ROT 10 11.4 M/L (STAPLE) ×2
CABLE HIGH FREQUENCY MONO STRZ (ELECTRODE) IMPLANT
CLIP APPLIE ROT 10 11.4 M/L (STAPLE) ×1 IMPLANT
CUTTER FLEX LINEAR 45M (STAPLE) ×2 IMPLANT
DERMABOND ADVANCED (GAUZE/BANDAGES/DRESSINGS) ×1
DERMABOND ADVANCED .7 DNX12 (GAUZE/BANDAGES/DRESSINGS) ×1 IMPLANT
DEVICE SUT QUICK LOAD TK 5 (STAPLE) IMPLANT
DEVICE SUT TI-KNOT TK 5X26 (MISCELLANEOUS) IMPLANT
DEVICE SUTURE ENDOST 10MM (ENDOMECHANICALS) IMPLANT
DEVICE TROCAR PUNCTURE CLOSURE (ENDOMECHANICALS) ×2 IMPLANT
DRAIN CHANNEL 19F RND (DRAIN) ×2 IMPLANT
DRAPE CAMERA CLOSED 9X96 (DRAPES) ×2 IMPLANT
ELECT REM PT RETURN 9FT ADLT (ELECTROSURGICAL) ×2
ELECTRODE REM PT RTRN 9FT ADLT (ELECTROSURGICAL) ×1 IMPLANT
EVACUATOR SILICONE 100CC (DRAIN) ×2 IMPLANT
GLOVE BIOGEL M 8.0 STRL (GLOVE) ×2 IMPLANT
GOWN STRL REIN XL XLG (GOWN DISPOSABLE) ×10 IMPLANT
HOVERMATT SINGLE USE (MISCELLANEOUS) ×2 IMPLANT
KIT BASIN OR (CUSTOM PROCEDURE TRAY) ×2 IMPLANT
MARKER SKIN DUAL TIP RULER LAB (MISCELLANEOUS) ×2 IMPLANT
NEEDLE SPNL 22GX3.5 QUINCKE BK (NEEDLE) ×2 IMPLANT
NS IRRIG 1000ML POUR BTL (IV SOLUTION) ×2 IMPLANT
PACK UNIVERSAL I (CUSTOM PROCEDURE TRAY) ×2 IMPLANT
PENCIL BUTTON HOLSTER BLD 10FT (ELECTRODE) ×2 IMPLANT
POUCH SPECIMEN RETRIEVAL 10MM (ENDOMECHANICALS) ×2 IMPLANT
RELOAD BLUE (STAPLE) ×6 IMPLANT
RELOAD ENDO STITCH (ENDOMECHANICALS) IMPLANT
RELOAD GREEN (STAPLE) ×4 IMPLANT
RELOAD STAPLE TA45 3.5 REG BLU (ENDOMECHANICALS) ×2 IMPLANT
SCISSORS LAP 5X45 EPIX DISP (ENDOMECHANICALS) ×2 IMPLANT
SEALANT SURGICAL APPL DUAL CAN (MISCELLANEOUS) ×2 IMPLANT
SET IRRIG TUBING LAPAROSCOPIC (IRRIGATION / IRRIGATOR) ×2 IMPLANT
SHEARS CURVED HARMONIC AC 45CM (MISCELLANEOUS) ×2 IMPLANT
SLEEVE XCEL OPT CAN 5 100 (ENDOMECHANICALS) ×6 IMPLANT
SOLUTION ANTI FOG 6CC (MISCELLANEOUS) ×2 IMPLANT
SPONGE GAUZE 4X4 12PLY (GAUZE/BANDAGES/DRESSINGS) IMPLANT
SPONGE LAP 18X18 X RAY DECT (DISPOSABLE) ×2 IMPLANT
STAPLE ECHEON FLEX 60 POW ENDO (STAPLE) ×2 IMPLANT
SUT ETHILON 2 0 PS N (SUTURE) IMPLANT
SUT VIC AB 0 CT1 27 (SUTURE) ×1
SUT VIC AB 0 CT1 27XBRD ANTBC (SUTURE) ×1 IMPLANT
SUT VIC AB 4-0 SH 18 (SUTURE) ×2 IMPLANT
SYR 20CC LL (SYRINGE) ×4 IMPLANT
SYR 50ML LL SCALE MARK (SYRINGE) ×2 IMPLANT
TOWEL OR 17X26 10 PK STRL BLUE (TOWEL DISPOSABLE) ×4 IMPLANT
TOWEL OR NON WOVEN STRL DISP B (DISPOSABLE) ×4 IMPLANT
TRAY FOLEY CATH 14FRSI W/METER (CATHETERS) ×2 IMPLANT
TROCAR ADV FIXATION 11X100MM (TROCAR) ×2 IMPLANT
TROCAR BLADELESS 15MM (ENDOMECHANICALS) ×2 IMPLANT
TROCAR XCEL 12X100 BLDLESS (ENDOMECHANICALS) ×2 IMPLANT
TROCAR XCEL NON-BLD 5MMX100MML (ENDOMECHANICALS) ×2 IMPLANT
TUBING CONNECTING 10 (TUBING) ×2 IMPLANT
TUBING ENDO SMARTCAP (MISCELLANEOUS) ×2 IMPLANT
TUBING FILTER THERMOFLATOR (ELECTROSURGICAL) ×2 IMPLANT

## 2013-03-05 NOTE — Preoperative (Signed)
Beta Blockers   Reason not to administer Beta Blockers:Not Applicable, beta blocker taken 03/05/13

## 2013-03-05 NOTE — Transfer of Care (Signed)
Immediate Anesthesia Transfer of Care Note  Patient: Brenda Bowman  Procedure(s) Performed: Procedure(s) (LRB): LAPAROSCOPIC GASTRIC SLEEVE RESECTION (N/A)  Patient Location: PACU  Anesthesia Type: General  Level of Consciousness: sedated, patient cooperative and responds to stimulation  Airway & Oxygen Therapy: Patient Spontanous Breathing and Patient connected to face mask oxgen  Post-op Assessment: Report given to PACU RN and Post -op Vital signs reviewed and stable  Post vital signs: Reviewed and stable  Complications: No apparent anesthesia complications

## 2013-03-05 NOTE — Op Note (Signed)
Brenda Bowman [id number] [birth year] 03/05/2013  Preoperative diagnosis: morbid obesity  Postoperative diagnosis: Same   Procedure: upper endoscopy   Surgeon: Mary Sella. Wesly Whisenant M.D., FACS   Anesthesia: Gen.   Indications for procedure: 52year old AAF undergoing Laparoscopic Gastric Sleeve Resection and an EGD was requested to evaluate the new gastric sleeve.   Description of procedure: After we have completed the sleeve resection, I scrubbed out and obtained the Olympus endoscope. I gently placed endoscope in the patient's oropharynx and gently glided it down the esophagus without any difficulty under direct visualization. Once I was in the gastric sleeve, I insufflated the stomach with air. I was able to cannulate and advanced the scope through the gastric sleeve. I was able to cannulate the duodenum with ease. Dr. Daphine Deutscher had placed saline in the upper abdomen. Upon further insufflation of the gastric sleeve there was no evidence of bubbles. Upon further inspection of the gastric sleeve, the mucosa appeared normal. There is no evidence of any mucosal abnormality. There was no corkscrewing or twisting of the sleeve. There was no evidence of bleeding. The gastric sleeve was decompressed. The scope was withdrawn. The patient tolerated this portion of the procedure well. Please see Dr Ermalene Searing operative note for details regarding the laparoscopic gastric sleeve resection.   Mary Sella. Andrey Campanile, MD, FACS  General, Bariatric, & Minimally Invasive Surgery  [facility], [place]

## 2013-03-05 NOTE — Op Note (Signed)
Surgeon: Wenda Low, MD, FACS  Asst:  Gaynelle Adu, MD, FACS  Anes:  general  Procedure: Laparoscopic sleeve gastrectomy with 34 Fr VisiGi, endoscopy per Dr. Andrey Campanile  Diagnosis: Morbid obesity  Complications: none  EBL:   minimal cc  Description of Procedure:  The patient was taken to room 11 in given general anesthesia. Intubation was provided by Dr. Rica Mast with some difficulty due to her large uvula. The abdomen was prepped with PCMX and draped sterilely. The spider BMI 43 most of her weight is carried in her abdomen which seemed much larger. After draping a timeout was performed and then access to the abdomen was achieved with a 5 mm Optiview through the left upper quadrant. Following insufflation of sequentially placed fives and one 12 to the right umbilicus which has subsequently changed and had a 15 in the upper midline and increase the camera to a 11 mm trocar.  The 15 mm in the upper midline was placed obliquely imaging of the case was approximated with a single Endo Stitch 0 Vicryl.  First measured 6 cm from the pylorus and began taking the short gastrics at that point. I carried this up to the left crus. Stomach was free. No bleeding was encountered around the spleen.The VISI G-tube 34 was passed and the stomach decompressed and it was placed into the antrum and placed on suction. 2 firings of the echelon by Ethicon using a green load distally followed by multiple firings with a 6 cm blue load. This defined the tube as we went up just to the left of the fat pad where I required a 4.5 mm stapler to complete dividing the near completely the divided stomach.  We then inflated with the busy G-tube no bubbles were seen. The pouch looked to be good and tubular. The more proximal staples were angled a little bit anteriorly but overall there was no angulation of the pouch. The busy G-tube was removed and Dr. Andrey Campanile endoscoped the patient. He showed no evidence of bleeding and again no leaks  were noted. Tisseel was applied along the staple appeared I did put one clip all area that might be oozing.  Stomach was removed through the 15 port without difficulty. Endo Close was used through that port although it was very oblique the Nathanson retractor which been used to retract the liver was removed. Ports were injected with Exparel encloses 4-0 Vicryl and Dermabond. Patient tolerated the procedure well and was taken to the recovery room in satisfactory condition.  Matt B. Daphine Deutscher, MD, [facility], [place] [phone]

## 2013-03-05 NOTE — Interval H&P Note (Signed)
History and Physical Interval Note:  03/05/2013 7:23 AM  Brenda Bowman  has presented today for surgery, with the diagnosis of morbid obesity   The various methods of treatment have been discussed with the patient and family. After consideration of risks, benefits and other options for treatment, the patient has consented to  Procedure(s): LAPAROSCOPIC GASTRIC SLEEVE RESECTION (N/A) as a surgical intervention .  The patient's history has been reviewed, patient examined, no change in status, stable for surgery.  I have reviewed the patient's chart and labs.  Questions were answered to the patient's satisfaction.     [name]

## 2013-03-05 NOTE — Anesthesia Postprocedure Evaluation (Signed)
Anesthesia Post Note  Patient: Brenda Bowman  Procedure(s) Performed: Procedure(s) (LRB): LAPAROSCOPIC GASTRIC SLEEVE RESECTION (N/A)  Anesthesia type: General  Patient location: PACU  Post pain: Pain level controlled  Post assessment: Post-op Vital signs reviewed  Last Vitals:  Filed Vitals:   03/05/13 1221  BP: 174/104  Pulse: 54  Temp: 36.5 C  Resp:     Post vital signs: Reviewed  Level of consciousness: sedated  Complications: No apparent anesthesia complications

## 2013-03-05 NOTE — Plan of Care (Signed)
Problem: Phase I Progression Outcomes Goal: Pain controlled with appropriate interventions Outcome: Progressing Pt experiencing pain at a level 1 right now. Pt is comfortable.  Goal: OOB as tolerated unless otherwise ordered Outcome: Progressing Patient ambulated in halls within 4 hours post surgery.  Goal: Voiding-avoid urinary catheter unless indicated Outcome: Progressing Pt voiding independently.  Goal: Hemodynamically stable Outcome: Progressing VS stable. Goal: Operative site clean or minimal drainage Outcome: Progressing Operative sites are clean, dry, and intact.  Goal: Diet - NPO Outcome: Progressing Pt remains NPO. Mouth swabs at bedside.

## 2013-03-05 NOTE — H&P (View-Only) (Signed)
Chief Complaint:  For sleeve gastrectomy on December 2  History of Present Illness:  Brenda Bowman is an 52 y.o. female who has completed the workup for sleeve gastrectomy.  Her upper GI series was negative for a hiatal hernia. Her ultrasound showed no evidence of gallstones. She has completed her review of the consent document will sign one here. She did not have any further questions regarding the sleeve gastrectomy. We'll proceed with sleeve gastrectomy and December 2.  Past Medical History  Diagnosis Date  . Hypertension   . HLD (hyperlipidemia)   . Ovarian mass 11/2009    per MRI report 11/05/2009 - bilateral fibroadenomas wv Brenner tumor; patient is following at [facility]  . Anxiety   . Depression   . Malaria 02-20-13    1'90-[place] was tx.  . Diabetes mellitus     oral  and Lantus  . Glaucoma 02-20-13    left eye -eye drop used    Past Surgical History  Procedure Laterality Date  . Bilateral oophorectomy    . Breath tek h pylori N/A 01/25/2013    Procedure: BREATH TEK H PYLORI;  Surgeon: Dionne Knoop B Wilma Wuthrich, MD;  Location: [facility];  Service: General;  Laterality: N/A;  . Dilation and curettage of uterus      Current Outpatient Prescriptions  Medication Sig Dispense Refill  . acetaminophen (TYLENOL) 500 MG tablet Take 1,000 mg by mouth every 6 (six) hours as needed for pain.      . atenolol (TENORMIN) 50 MG tablet Take 50 mg by mouth 2 (two) times daily.      . diclofenac sodium (VOLTAREN) 1 % GEL Apply 2 g topically 2 (two) times daily as needed (pain).  100 g  1  . DM-Phenylephrine-Acetaminophen (ALKA-SELTZER PLS SINUS & COUGH) 10-5-325 MG CAPS Take 2 capsules by mouth as needed (cold).      . glipiZIDE (GLUCOTROL) 5 MG tablet Take 5 mg by mouth 2 (two) times daily before a meal.      . hydrochlorothiazide (HYDRODIURIL) 25 MG tablet Take 25 mg by mouth every morning.      . Insulin Glargine (LANTUS SOLOSTAR) 100 UNIT/ML SOPN Inject 45 Units into the skin  at bedtime.      . losartan (COZAAR) 50 MG tablet Take 50 mg by mouth every morning.       . lurasidone (LATUDA) 40 MG TABS tablet Take 40 mg by mouth daily with breakfast.      . Oxymetazoline HCl (VICKS SINEX 12 HOUR NA) Place 1 spray into both nostrils as needed (congestion).      . vitamin B-12 (CYANOCOBALAMIN) 1000 MCG tablet Take 1,000 mcg by mouth 2 (two) times daily.       No current facility-administered medications for this visit.   Codeine; Metformin and related; and Morphine Family History  Problem Relation Age of Onset  . Stroke Neg Hx   . Miscarriages / Stillbirths Neg Hx   . Heart disease Neg Hx   . Cancer Neg Hx    Social History:   reports that she has never smoked. She has never used smokeless tobacco. She reports that she does not drink alcohol or use illicit drugs.   REVIEW OF SYSTEMS - PERTINENT POSITIVES ONLY: negative  Physical Exam:   Blood pressure 128/78, pulse 72, temperature 98.2 F (36.8 C), temperature source Temporal, resp. rate 14, height 5' 4.5" (1.638 m), weight 259 lb 6.4 oz (117.663 kg), last menstrual period 01/22/2012. Body mass index   is 43.85 kg/(m^2).  Gen:  WDWN African American female NAD  Neurological: Alert and oriented to person, place, and time. Motor and sensory function is grossly intact  Head: Normocephalic and atraumatic.  Eyes: Conjunctivae are normal. Pupils are equal, round, and reactive to light. No scleral icterus.  Neck: Normal range of motion. Neck supple. No tracheal deviation or thyromegaly present.  Cardiovascular:  SR without murmurs or gallops.  No carotid bruits Respiratory: Effort normal.  No respiratory distress. No chest wall tenderness. Breath sounds normal.  No wheezes, rales or rhonchi.  Abdomen:  nontender and obese GU: Musculoskeletal: Normal range of motion. Extremities are nontender. No cyanosis, edema or clubbing noted Lymphadenopathy: No cervical, preauricular, postauricular or axillary adenopathy is  present Skin: Skin is warm and dry. No rash noted. No diaphoresis. No erythema. No pallor. Pscyh: Normal mood and affect. Behavior is normal. Judgment and thought content normal.   LABORATORY RESULTS: Results for orders placed during the hospital encounter of 02/20/13 (from the past 48 hour(s))  CBC     Status: None   Collection Time    02/20/13  3:35 PM      Result Value Range   WBC 9.9  4.0 - 10.5 K/uL   RBC 5.04  3.87 - 5.11 MIL/uL   Hemoglobin 14.3  12.0 - 15.0 g/dL   HCT 42.9  36.0 - 46.0 %   MCV 85.1  78.0 - 100.0 fL   MCH 28.4  26.0 - 34.0 pg   MCHC 33.3  30.0 - 36.0 g/dL   RDW 14.3  11.5 - 15.5 %   Platelets 308  150 - 400 K/uL  COMPREHENSIVE METABOLIC PANEL     Status: Abnormal   Collection Time    02/20/13  3:35 PM      Result Value Range   Sodium 138  135 - 145 mEq/L   Potassium 3.6  3.5 - 5.1 mEq/L   Chloride 98  96 - 112 mEq/L   CO2 30  19 - 32 mEq/L   Glucose, Bld 104 (*) 70 - 99 mg/dL   BUN 27 (*) 6 - 23 mg/dL   Creatinine, Ser 0.83  0.50 - 1.10 mg/dL   Calcium 10.4  8.4 - 10.5 mg/dL   Total Protein 8.1  6.0 - 8.3 g/dL   Albumin 4.0  3.5 - 5.2 g/dL   AST 17  0 - 37 U/L   ALT 16  0 - 35 U/L   Alkaline Phosphatase 61  39 - 117 U/L   Total Bilirubin 0.2 (*) 0.3 - 1.2 mg/dL   GFR calc non Af Amer 80 (*) >90 mL/min   GFR calc Af Amer >90  >90 mL/min   Comment: (NOTE)     The eGFR has been calculated using the CKD EPI equation.     This calculation has not been validated in all clinical situations.     eGFR's persistently <90 mL/min signify possible Chronic Kidney     Disease.    RADIOLOGY RESULTS: No results found.  Problem List: Patient Active Problem List   Diagnosis Date Noted  . History of removal of both ovaries 01/10/2013  . Routine health maintenance 02/08/2012  . Failure to attend appointment 01/17/2012  . Hyperlipidemia LDL goal < 100 08/31/2011  . Infertility, female 08/24/2011  . Severe obesity (BMI >= 40) 08/24/2011  . OVARIAN MASS  11/24/2009  . Hirsutism 08/06/2009  . Type II or unspecified type diabetes mellitus without mention of complication, not stated   as uncontrolled 07/30/2009  . Unspecified essential hypertension 07/30/2009    Assessment & Plan: Morbid obesity BMI is 43.8 Plan sleeve gastrectomy    Matt B. Branko Steeves, MD, FACS  [facility] [phone] beeper [phone]  02/22/2013 3:30 PM     

## 2013-03-06 ENCOUNTER — Inpatient Hospital Stay (HOSPITAL_COMMUNITY): Payer: BC Managed Care – PPO

## 2013-03-06 ENCOUNTER — Encounter (HOSPITAL_COMMUNITY): Payer: Self-pay | Admitting: Surgery

## 2013-03-06 DIAGNOSIS — Z9884 Bariatric surgery status: Secondary | ICD-10-CM

## 2013-03-06 DIAGNOSIS — Z9889 Other specified postprocedural states: Secondary | ICD-10-CM

## 2013-03-06 LAB — GLUCOSE, CAPILLARY
Glucose-Capillary: 136 mg/dL — ABNORMAL HIGH (ref 70–99)
Glucose-Capillary: 125 mg/dL — ABNORMAL HIGH (ref 70–99)

## 2013-03-06 LAB — CBC WITH DIFFERENTIAL/PLATELET
Basophils Absolute: 0 10*3/uL (ref 0.0–0.1)
Eosinophils Relative: 1 % (ref 0–5)
HCT: 36.7 % (ref 36.0–46.0)
Lymphocytes Relative: 24 % (ref 12–46)
Monocytes Absolute: 0.5 10*3/uL (ref 0.1–1.0)
Monocytes Relative: 5 % (ref 3–12)
Neutro Abs: 7.5 10*3/uL (ref 1.7–7.7)
Platelets: 219 10*3/uL (ref 150–400)
RBC: 4.28 MIL/uL (ref 3.87–5.11)
RDW: 14.3 % (ref 11.5–15.5)
WBC: 10.7 10*3/uL — ABNORMAL HIGH (ref 4.0–10.5)

## 2013-03-06 LAB — HEMOGLOBIN AND HEMATOCRIT, BLOOD: HCT: 36.1 % (ref 36.0–46.0)

## 2013-03-06 MED ORDER — DIPHENHYDRAMINE HCL 50 MG/ML IJ SOLN
INTRAMUSCULAR | Status: AC
  Filled 2013-03-06: qty 1

## 2013-03-06 MED ORDER — DIPHENHYDRAMINE HCL 50 MG/ML IJ SOLN
25.0000 mg | INTRAMUSCULAR | Status: DC | PRN
  Administered 2013-03-06 – 2013-03-07 (×2): 25 mg via INTRAVENOUS
  Filled 2013-03-06: qty 1

## 2013-03-06 MED ORDER — LIP MEDEX EX OINT
TOPICAL_OINTMENT | CUTANEOUS | Status: AC
  Administered 2013-03-06: 03:00:00
  Filled 2013-03-06: qty 7

## 2013-03-06 MED ORDER — IOHEXOL 300 MG/ML  SOLN
50.0000 mL | Freq: Once | INTRAMUSCULAR | Status: AC | PRN
  Administered 2013-03-06: 50 mL via INTRAVENOUS

## 2013-03-06 NOTE — Plan of Care (Signed)
Problem: Phase I Progression Outcomes Goal: Pain controlled with appropriate interventions Outcome: Progressing Pain better controlled today. Medication and ambulation helping.   Problem: Phase II Progression Outcomes Goal: Activity at appropriate level-compared to baseline (UP IN CHAIR FOR HEMODIALYSIS)  Outcome: Progressing Pt ambulating in halls.  Goal: Hemodynamically stable Outcome: Progressing VS still remain stable.  Goal: Upper GI SWALLOW ACCEPTABLE Outcome: Progressing Upper GUI series acceptable.  Goal: Doppler acceptable Outcome: Progressing Doppler acceptable.  Goal: Tolerating diet advance to Outcome: Progressing Pt tolerating 2oz  water q4h.

## 2013-03-06 NOTE — Progress Notes (Signed)
VASCULAR LAB PRELIMINARY  PRELIMINARY  PRELIMINARY  PRELIMINARY  Bilateral lower extremity venous duplex completed.    Preliminary report:  Bilateral:  No evidence of DVT, superficial thrombosis, or Baker's Cyst.   Blaize Epple, RVS 03/06/2013, 8:33 AM

## 2013-03-06 NOTE — Progress Notes (Signed)
Pt maintaining o2 sats greater than 94% on RA. Discontinuing continuous pulse ox per order.

## 2013-03-06 NOTE — Progress Notes (Signed)
Patient ID: Brenda Bowman, female   DOB: Jan 01, 1961, 52 y.o.   MRN: [id number] [facility] Progress Note:   1 Day Post-Op  Subjective: Mental status is clear Objective: Vital signs in last 24 hours: Temp:  [97.9 F (36.6 C)-99.9 F (37.7 C)] 98.6 F (37 C) (12/03 1400) Pulse Rate:  [66-88] 76 (12/03 1400) Resp:  [16-18] 18 (12/03 1400) BP: (145-174)/(71-101) 174/88 mmHg (12/03 1400) SpO2:  [92 %-100 %] 99 % (12/03 1400)  Intake/Output from previous day: 12/02 0701 - 12/03 0700 In: 4195.8 [I.V.:4195.8] Out: 1275 [Urine:1250; Blood:25] Intake/Output this shift: Total I/O In: 1040.8 [P.O.:120; I.V.:920.8] Out: 300 [Urine:300]  Physical Exam: Work of breathing is normal.  Minimal pain  Lab Results:  Results for orders placed during the hospital encounter of 03/05/13 (from the past 48 hour(s))  PREGNANCY, URINE     Status: None   Collection Time    03/05/13  5:24 AM      Result Value Range   Preg Test, Ur NEGATIVE  NEGATIVE   Comment:            THE SENSITIVITY OF THIS     METHODOLOGY IS >20 mIU/mL.  GLUCOSE, CAPILLARY     Status: Abnormal   Collection Time    03/05/13  5:42 AM      Result Value Range   Glucose-Capillary 166 (*) 70 - 99 mg/dL   Comment 1 Documented in Chart    GLUCOSE, CAPILLARY     Status: Abnormal   Collection Time    03/05/13 10:53 AM      Result Value Range   Glucose-Capillary 190 (*) 70 - 99 mg/dL  HEMOGLOBIN W0J     Status: Abnormal   Collection Time    03/05/13 12:29 PM      Result Value Range   Hemoglobin A1C 8.6 (*) <5.7 %   Comment: (NOTE)                                                                               According to the ADA Clinical Practice Recommendations for 2011, when     HbA1c is used as a screening test:      >=6.5%   Diagnostic of Diabetes Mellitus               (if abnormal result is confirmed)     5.7-6.4%   Increased risk of developing Diabetes Mellitus     References:Diagnosis and  Classification of Diabetes Mellitus,Diabetes     Care,2011,34(Suppl 1):S62-S69 and Standards of Medical Care in             Diabetes - 2011,Diabetes Care,2011,34 (Suppl 1):S11-S61.   Mean Plasma Glucose 200 (*) <117 mg/dL   Comment: Performed at [facility]  CBC     Status: Abnormal   Collection Time    03/05/13 12:29 PM      Result Value Range   WBC 14.5 (*) 4.0 - 10.5 K/uL   RBC 4.55  3.87 - 5.11 MIL/uL   Hemoglobin 13.1  12.0 - 15.0 g/dL   HCT 81.1  91.4 - 78.2 %   MCV 86.4  78.0 - 100.0 fL   MCH 28.8  26.0 - 34.0 pg   MCHC 33.3  30.0 - 36.0 g/dL   RDW 14.7  82.9 - 56.2 %   Platelets 236  150 - 400 K/uL  CREATININE, SERUM     Status: Abnormal   Collection Time    03/05/13 12:29 PM      Result Value Range   Creatinine, Ser 0.92  0.50 - 1.10 mg/dL   GFR calc non Af Amer 70 (*) >90 mL/min   GFR calc Af Amer 82 (*) >90 mL/min   Comment: (NOTE)     The eGFR has been calculated using the CKD EPI equation.     This calculation has not been validated in all clinical situations.     eGFR's persistently <90 mL/min signify possible Chronic Kidney     Disease.  GLUCOSE, CAPILLARY     Status: Abnormal   Collection Time    03/05/13  4:12 PM      Result Value Range   Glucose-Capillary 224 (*) 70 - 99 mg/dL  GLUCOSE, CAPILLARY     Status: Abnormal   Collection Time    03/05/13  8:01 PM      Result Value Range   Glucose-Capillary 148 (*) 70 - 99 mg/dL  GLUCOSE, CAPILLARY     Status: Abnormal   Collection Time    03/05/13 11:49 PM      Result Value Range   Glucose-Capillary 106 (*) 70 - 99 mg/dL  GLUCOSE, CAPILLARY     Status: Abnormal   Collection Time    03/06/13  3:38 AM      Result Value Range   Glucose-Capillary 136 (*) 70 - 99 mg/dL  CBC WITH DIFFERENTIAL     Status: Abnormal   Collection Time    03/06/13  5:30 AM      Result Value Range   WBC 10.7 (*) 4.0 - 10.5 K/uL   RBC 4.28  3.87 - 5.11 MIL/uL   Hemoglobin 12.5  12.0 - 15.0 g/dL   HCT 13.0  86.5 - 78.4 %    MCV 85.7  78.0 - 100.0 fL   MCH 29.2  26.0 - 34.0 pg   MCHC 34.1  30.0 - 36.0 g/dL   RDW 69.6  29.5 - 28.4 %   Platelets 219  150 - 400 K/uL   Neutrophils Relative % 70  43 - 77 %   Neutro Abs 7.5  1.7 - 7.7 K/uL   Lymphocytes Relative 24  12 - 46 %   Lymphs Abs 2.5  0.7 - 4.0 K/uL   Monocytes Relative 5  3 - 12 %   Monocytes Absolute 0.5  0.1 - 1.0 K/uL   Eosinophils Relative 1  0 - 5 %   Eosinophils Absolute 0.2  0.0 - 0.7 K/uL   Basophils Relative 0  0 - 1 %   Basophils Absolute 0.0  0.0 - 0.1 K/uL  GLUCOSE, CAPILLARY     Status: Abnormal   Collection Time    03/06/13  8:19 AM      Result Value Range   Glucose-Capillary 134 (*) 70 - 99 mg/dL  GLUCOSE, CAPILLARY     Status: Abnormal   Collection Time    03/06/13 12:08 PM      Result Value Range   Glucose-Capillary 125 (*) 70 - 99 mg/dL  GLUCOSE, CAPILLARY     Status: Abnormal   Collection Time    03/06/13  4:10 PM      Result Value Range  Glucose-Capillary 103 (*) 70 - 99 mg/dL  HEMOGLOBIN AND HEMATOCRIT, BLOOD     Status: None   Collection Time    03/06/13  4:19 PM      Result Value Range   Hemoglobin 12.0  12.0 - 15.0 g/dL   HCT 78.2  95.6 - 21.3 %    Radiology/Results: Dg Ugi W/water Sol Cm  03/06/2013   CLINICAL DATA:  Gastric sleeve procedure yesterday.  EXAM: WATER SOLUBLE UPPER GI SERIES  TECHNIQUE: Single-column upper GI series was performed using water soluble contrast.  CONTRAST:  50mL OMNIPAQUE IOHEXOL 300 MG/ML  SOLN  COMPARISON:  None.  FLUOROSCOPY TIME:  0 min 49 seconds.  FINDINGS: Scout view of the abdomen shows postoperative changes in the left upper quadrant. Gas is seen in nondilated colon.  Patient drank 50 cc Omnipaque 300.  Postoperative changes of a gastric sleeve procedure. Contrast flows readily through the residual stomach into the proximal small bowel.  IMPRESSION: Postoperative changes of gastric sleeve procedure without complicating feature.   Electronically Signed   By: Leanna Battles M.D.    On: 03/06/2013 09:52    Anti-infectives: Anti-infectives   Start     Dose/Rate Route Frequency Ordered Stop   03/05/13 0600  cefOXitin (MEFOXIN) 2 g in dextrose 5 % 50 mL IVPB     2 g 100 mL/hr over 30 Minutes Intravenous On call to O.R. 03/05/13 0517 03/05/13 0810      Assessment/Plan: Problem List: Patient Active Problem List   Diagnosis Date Noted  . Lap Sleeve Gastrectomy Dec 2014 03/06/2013  . Morbid obesity 03/05/2013  . History of removal of both ovaries 01/10/2013  . Routine health maintenance 02/08/2012  . Failure to attend appointment 01/17/2012  . Hyperlipidemia LDL goal < 100 08/31/2011  . Infertility, female 08/24/2011  . Severe obesity (BMI >= 40) 08/24/2011  . OVARIAN MASS 11/24/2009  . Hirsutism 08/06/2009  . Type II or unspecified type diabetes mellitus without mention of complication, not stated as uncontrolled 07/30/2009  . Unspecified essential hypertension 07/30/2009    UGI looked OK.  Started on clears.   1 Day Post-Op    LOS: 1 day   Matt B. Daphine Deutscher, MD, [facility] [phone] beeper [phone]  03/06/2013 5:50 PM

## 2013-03-06 NOTE — Care Management Note (Signed)
    Page 1 of 1   03/06/2013     11:24:50 AM   CARE MANAGEMENT NOTE 03/06/2013  Patient:  Brenda Bowman, Brenda Bowman   Account Number:  [id number]  Date Initiated:  03/06/2013  Documentation initiated by:  Lorenda Ishihara  Subjective/Objective Assessment:   51 yo female admitted s/p sleeve gastrectomy. PTA lived at home with sister.     Action/Plan:   Home when stable   Anticipated DC Date:  03/08/2013   Anticipated DC Plan:  HOME/SELF CARE      DC Planning Services  CM consult      Choice offered to / List presented to:             Status of service:  Completed, signed off Medicare Important Message given?   (If response is "NO", the following Medicare IM given date fields will be blank) Date Medicare IM given:   Date Additional Medicare IM given:    Discharge Disposition:  HOME/SELF CARE  Per UR Regulation:  Reviewed for med. necessity/level of care/duration of stay  If discussed at Long Length of Stay Meetings, dates discussed:    Comments:

## 2013-03-07 LAB — GLUCOSE, CAPILLARY
Glucose-Capillary: 128 mg/dL — ABNORMAL HIGH (ref 70–99)
Glucose-Capillary: 138 mg/dL — ABNORMAL HIGH (ref 70–99)
Glucose-Capillary: 90 mg/dL (ref 70–99)
Glucose-Capillary: 98 mg/dL (ref 70–99)

## 2013-03-07 LAB — CBC WITH DIFFERENTIAL/PLATELET
Basophils Relative: 0 % (ref 0–1)
Eosinophils Absolute: 0.4 10*3/uL (ref 0.0–0.7)
HCT: 36.7 % (ref 36.0–46.0)
Hemoglobin: 12.1 g/dL (ref 12.0–15.0)
Lymphs Abs: 2.6 10*3/uL (ref 0.7–4.0)
MCH: 28.5 pg (ref 26.0–34.0)
MCHC: 33 g/dL (ref 30.0–36.0)
Monocytes Absolute: 0.7 10*3/uL (ref 0.1–1.0)
Monocytes Relative: 6 % (ref 3–12)
Neutro Abs: 6.5 10*3/uL (ref 1.7–7.7)
Neutrophils Relative %: 64 % (ref 43–77)
RDW: 14 % (ref 11.5–15.5)

## 2013-03-07 MED ORDER — HYDROCODONE-ACETAMINOPHEN 7.5-325 MG/15ML PO SOLN
5.0000 mL | ORAL | Status: DC | PRN
  Administered 2013-03-07: 5 mL via ORAL
  Filled 2013-03-07: qty 15

## 2013-03-07 NOTE — Progress Notes (Signed)
Patient alert and oriented, pain is controlled. Patient is tolerating fluids, plan to advance to protein shake today.  Reviewed Gastric Bypass discharge instructions with patient and patient is able to articulate understanding.  Provided information on BELT, Support Groups, and [facility] outpatient pharmacy offerings.  All questions answered, will continue to monitor.

## 2013-03-08 LAB — GLUCOSE, CAPILLARY: Glucose-Capillary: 121 mg/dL — ABNORMAL HIGH (ref 70–99)

## 2013-03-08 MED ORDER — HYDROCODONE-ACETAMINOPHEN 7.5-325 MG/15ML PO SOLN: 5.0000 mL | ORAL | Status: AC | PRN

## 2013-03-08 NOTE — Progress Notes (Signed)
Patient ID: Brenda Bowman, female   DOB: 04/24/1960, 52 y.o.   MRN: [id number] Ready for discharge this morning.  See discharge note.

## 2013-03-08 NOTE — Progress Notes (Signed)
Pt for d/c home today. IV d/c'd & dressing applied. Dermabonds to abdominal incision intact. Denies pain, or N/V at this time. Discharge instructions & RX given to pt with verbalized understanding. Pt aware of her follow-up appointments scheduled. Friend will assist her with d/c as claimed by pt.

## 2013-03-10 NOTE — Discharge Summary (Signed)
Physician Discharge Summary  Patient ID: Brenda Bowman MRN: [id number] DOB/AGE: [birth year] 52 y.o.  Admit date: 03/05/2013 Discharge date: 03/10/2013  Admission Diagnoses:  Morbid obesity  Discharge Diagnoses:  same  Active Problems:   Morbid obesity   Lap Sleeve Gastrectomy Dec 2014   Surgery:  Lap sleeve gastrectomy  Discharged Condition: improved  Hospital Course:   Had surgery.  PD 1 had DVT check which was OK and UGI which showed no leak.  Started on clears and advanced slowly.  Ready for discharge 2 days later  Consults: none  Significant Diagnostic Studies: ugi    Discharge Exam: Blood pressure 152/92, pulse 95, temperature 98.7 F (37.1 C), temperature source Oral, resp. rate 20, height 5\' 5"  (1.651 m), weight 264 lb 6 oz (119.92 kg), last menstrual period 01/22/2012, SpO2 95.00%. Incisions bland.   Disposition: 01-Home or Self Care  Discharge Orders   Future Appointments Provider Department Dept Phone   03/19/2013 3:30 PM [facility] Post-Op Class [facility] [phone]   03/22/2013 9:50 AM Valarie Merino, MD [facility] [place] [phone]   Future Orders Complete By Expires   Diet - low sodium heart healthy  As directed    Discharge instructions  As directed    Comments:     Follow diet as prescribed.   Increase activity slowly  As directed    No wound care  As directed        Medication List    STOP taking these medications       ALKA-SELTZER PLS SINUS & COUGH 10-5-325 MG Caps  Generic drug:  DM-Phenylephrine-Acetaminophen      TAKE these medications       acetaminophen 500 MG tablet  Commonly known as:  TYLENOL  Take 1,000 mg by mouth every 6 (six) hours as needed for pain.     atenolol 50 MG tablet  Commonly known as:  TENORMIN  Take 50 mg by mouth 2 (two) times daily.     diclofenac sodium 1 % Gel  Commonly known as:  VOLTAREN  Apply 2 g topically 2 (two) times daily as needed  (pain).     glipiZIDE 5 MG tablet  Commonly known as:  GLUCOTROL  Take 5 mg by mouth 2 (two) times daily before a meal.     hydrochlorothiazide 25 MG tablet  Commonly known as:  HYDRODIURIL  Take 25 mg by mouth every morning.     HYDROcodone-acetaminophen 7.5-325 mg/15 ml solution  Commonly known as:  HYCET  Take 5-10 mLs by mouth every 4 (four) hours as needed for moderate pain.     LANTUS SOLOSTAR 100 UNIT/ML Sopn  Generic drug:  Insulin Glargine  Inject 45 Units into the skin at bedtime.     losartan 50 MG tablet  Commonly known as:  COZAAR  Take 50 mg by mouth every morning.     lurasidone 40 MG Tabs tablet  Commonly known as:  LATUDA  Take 40 mg by mouth daily with breakfast.     VICKS SINEX 12 HOUR NA  Place 1 spray into both nostrils as needed (congestion).     vitamin B-12 1000 MCG tablet  Commonly known as:  CYANOCOBALAMIN  Take 1,000 mcg by mouth 2 (two) times daily.         SignedLuretha Murphy B 03/10/2013, 5:10 AM

## 2013-03-11 ENCOUNTER — Telehealth (INDEPENDENT_AMBULATORY_CARE_PROVIDER_SITE_OTHER): Payer: Self-pay | Admitting: General Surgery

## 2013-03-11 NOTE — Telephone Encounter (Signed)
Patient called status post gastric sleeve on 03/05/2013 complaining of night sweats that started yesterday. She states she has felt "cold" since leaving the hospital and states she had a crying fit yesterday out of no where. She has not had any fevers. No abdominal pain, other than soreness. She has had no nausea or vomiting but states she does have some soreness when swallowing. I told her the symptoms don't sound too concerning but I would send a message to Dr Daphine Deutscher to make him aware and we would call her back if he had any concerns over symptoms. [phone].

## 2013-03-14 ENCOUNTER — Other Ambulatory Visit (INDEPENDENT_AMBULATORY_CARE_PROVIDER_SITE_OTHER): Payer: Self-pay | Admitting: Surgery

## 2013-03-14 DIAGNOSIS — R61 Generalized hyperhidrosis: Secondary | ICD-10-CM

## 2013-03-14 LAB — CBC WITH DIFFERENTIAL/PLATELET
Basophils Relative: 0 % (ref 0–1)
Eosinophils Absolute: 0.2 10*3/uL (ref 0.0–0.7)
Lymphocytes Relative: 26 % (ref 12–46)
MCH: 28.9 pg (ref 26.0–34.0)
MCHC: 34 g/dL (ref 30.0–36.0)
Monocytes Relative: 6 % (ref 3–12)
Neutro Abs: 6.1 10*3/uL (ref 1.7–7.7)
Neutrophils Relative %: 65 % (ref 43–77)
Platelets: 349 10*3/uL (ref 150–400)
WBC: 9.3 10*3/uL (ref 4.0–10.5)

## 2013-03-14 NOTE — Telephone Encounter (Signed)
Called pt and informed her that after speaking with Dr. Daphine Deutscher, he would like to have some precautionary lab work done for her including a CBC w/ diff to r/o any infection.  Explained to the patient that Loney Loh will already have her abs ready and that she will need to go to the [facility] building located at [employer]. [place].  She said she would get there today.

## 2013-03-19 ENCOUNTER — Encounter: Payer: BC Managed Care – PPO | Attending: Surgery

## 2013-03-19 DIAGNOSIS — E669 Obesity, unspecified: Secondary | ICD-10-CM | POA: Insufficient documentation

## 2013-03-19 DIAGNOSIS — Z713 Dietary counseling and surveillance: Secondary | ICD-10-CM | POA: Insufficient documentation

## 2013-03-19 NOTE — Patient Instructions (Signed)
Patient to follow Phase 3A-Soft, High Protein Diet and follow-up at [facility] in 6 weeks for 2 months post-op nutrition visit for diet advancement. 

## 2013-03-19 NOTE — Progress Notes (Signed)
Bariatric Class:  Appt start time: 1530 end time:  1630.  2 Week Post-Operative Nutrition Class  Patient was seen on 03/19/13 for Post-[facility]ative [facility].   Surgery date: 03/04/13 Surgery type: Sleeve Starting weight at [facility]: 260 lbs on 01/10/13  Weight today: 250.0 lbs Weight change: 10 lbs Total weight lost: 10 lbs  TANITA  BODY COMP RESULTS  03/19/13   BMI (kg/m^2) 42.3   Fat Mass (lbs) 136.0   Fat Free Mass (lbs) 114.0   Total Body Water (lbs) 83.5   The following the learning objectives were met by the patient during this course:  Identifies Phase 3A (Soft, High Proteins) Dietary Goals and will begin from 2 weeks post-operatively to 2 months post-operatively  Identifies appropriate sources of fluids and proteins   States protein recommendations and appropriate sources post-operatively  Identifies the need for appropriate texture modifications, mastication, and bite sizes when consuming solids  Identifies appropriate multivitamin and calcium sources post-operatively  Describes the need for physical activity post-operatively and will follow MD recommendations  States when to call healthcare provider regarding medication questions or post-operative complications  Handouts given during class include:  Phase 3A: Soft, High Protein Diet Handout  Band Fill Guidelines Handout  Follow-Up Plan: Patient will follow-up at [facility] in 6 weeks for 8 week post-op nutrition visit for diet advancement per MD.

## 2013-03-22 ENCOUNTER — Encounter (INDEPENDENT_AMBULATORY_CARE_PROVIDER_SITE_OTHER): Payer: Self-pay | Admitting: Surgery

## 2013-03-22 ENCOUNTER — Ambulatory Visit (INDEPENDENT_AMBULATORY_CARE_PROVIDER_SITE_OTHER): Payer: BC Managed Care – PPO | Admitting: Surgery

## 2013-03-22 ENCOUNTER — Telehealth (INDEPENDENT_AMBULATORY_CARE_PROVIDER_SITE_OTHER): Payer: Self-pay

## 2013-03-22 VITALS — BP 134/78 | HR 72 | Temp 98.0°F | Resp 18 | Ht 64.5 in | Wt 251.0 lb

## 2013-03-22 DIAGNOSIS — Z9884 Bariatric surgery status: Secondary | ICD-10-CM

## 2013-03-22 NOTE — Progress Notes (Signed)
Brenda Bowman 52 y.o.  Body mass index is 42.43 kg/(m^2).  Patient Active Problem List   Diagnosis Date Noted  . Lap Sleeve Gastrectomy Dec 2014 03/06/2013  . Morbid obesity 03/05/2013  . History of removal of both ovaries 01/10/2013  . Routine health maintenance 02/08/2012  . Failure to attend appointment 01/17/2012  . Hyperlipidemia LDL goal < 100 08/31/2011  . Infertility, female 08/24/2011  . Severe obesity (BMI >= 40) 08/24/2011  . OVARIAN MASS 11/24/2009  . Hirsutism 08/06/2009  . Type II or unspecified type diabetes mellitus without mention of complication, not stated as uncontrolled 07/30/2009  . Unspecified essential hypertension 07/30/2009    Allergies  Allergen Reactions  . Codeine Itching  . Metformin And Related     Diarrhea   . Morphine Itching and Rash    Past Surgical History  Procedure Laterality Date  . Bilateral oophorectomy    . Breath tek h pylori N/A 01/25/2013    Procedure: BREATH TEK H PYLORI;  Surgeon: Valarie Merino, MD;  Location: [facility];  Service: General;  Laterality: N/A;  . Dilation and curettage of uterus    . Laparoscopic gastric sleeve resection N/A 03/05/2013    Procedure: LAPAROSCOPIC GASTRIC SLEEVE RESECTION;  Surgeon: Valarie Merino, MD;  Location: [facility];  Service: General;  Laterality: N/A;   Dorrene German, MD No diagnosis found.  Incisions are bland.  Sweats resolved.  Feeling better.  Glucose control and BP are better.   Return 8 weeks.  Matt B. Daphine Deutscher, MD, [facility] [phone] beeper [phone]  03/22/2013 12:29 PM

## 2013-03-22 NOTE — Patient Instructions (Signed)
Sleeve Gastrectomy, Care After Refer to this sheet in the next few weeks. These instructions provide you with information on caring for yourself after your procedure. Your surgeon may also give you more specific instructions. Your treatment has been planned according to current medical practices, but problems sometimes occur. Call your surgeon if you have any problems or questions after your procedure. HOME CARE INSTRUCTIONS  Get plenty of rest, but move around frequently for short periods or take short walks as directed by your surgeon. Increase your activities gradually.  Only take over-the-counter or prescription medicines as directed by your surgeon.  Keep incision areas clean and dry. Remove or change any bandages (dressings) only as directed by your surgeon. You may have skin adhesive strips or glue over the incision areas. Do not take the strips or the glue off. They will fall off on their own.  Check your incisions and surrounding areas daily for any redness, swelling, or drainage of fluid.  Take showers once your surgeon approves. Until then, only take sponge baths. Pat incisions dry. Do not rub incisions with a washcloth or towel. Do not take tub baths or go swimming until your surgeon approves. Do not put anything on your incision to clean it unless directed to do so by your surgeon.  Limit activities as directed by your surgeon. You will need to avoid strenuous activity, heavy lifting, and pushing or pulling things with your arms for several weeks. Do not lift anything heavier than 10 lb (4.5 kg).  Perform deep breathing exercises and coughing as directed by your surgeon. This helps prevent a lung infection.  Do not drive until your surgeon approves.  Follow all of the dietary instructions provided by your surgeon or dietitian. You will receive specific instructions on the type, size, and timing of meals.      You may need to stay on a liquid diet for some time after the  surgery.  Drink fluids frequently. You should drink 1 oz of fluid as often as you can.  Take vitamin, calcium, and protein supplements as directed by your surgeon.  If you have a drain from the incision area, make sure you:      Keep the area of the drain clean and dry.  Empty the drain and record the amount of fluid daily.  Talk with your surgeon about when you may return to work and about your exercise routine.   Keep all follow-up appointments with your surgeon and dietitian. SEEK MEDICAL CARE IF:  Your pain is not controlled with medicine.  You have a fever.  You have shaking chills.  You notice any redness, skin irritation, swelling, or drainage of fluid (other than light red) in the incision area.  Your drain gets pulled out accidentally.   Your drain contains bright red blood, green fluid, or fluid that has a foul smell. SEEK IMMEDIATE MEDICAL CARE IF:  You have difficulty breathing.  You have severe calf pain. MAKE SURE YOU:  Understand these instructions.  Will watch your condition.  Will get help right away if you are not doing well or get worse. Document Released: 01/15/2009 Document Revised: 11/21/2012 Document Reviewed: 08/03/2012 [employer] Patient Information 2014 [employer].  

## 2013-03-22 NOTE — Telephone Encounter (Signed)
Pt given appt

## 2013-03-25 ENCOUNTER — Encounter (HOSPITAL_COMMUNITY): Payer: Self-pay | Admitting: Surgery

## 2013-04-10 ENCOUNTER — Ambulatory Visit: Payer: BC Managed Care – PPO

## 2013-04-11 ENCOUNTER — Ambulatory Visit: Payer: BC Managed Care – PPO | Attending: Internal Medicine

## 2013-04-17 ENCOUNTER — Ambulatory Visit: Payer: BC Managed Care – PPO

## 2013-04-17 ENCOUNTER — Ambulatory Visit (INDEPENDENT_AMBULATORY_CARE_PROVIDER_SITE_OTHER): Payer: BC Managed Care – PPO | Admitting: Surgery

## 2013-04-18 ENCOUNTER — Ambulatory Visit: Payer: BC Managed Care – PPO

## 2013-04-25 ENCOUNTER — Ambulatory Visit: Payer: BC Managed Care – PPO | Admitting: Internal Medicine

## 2013-05-02 ENCOUNTER — Ambulatory Visit: Payer: BC Managed Care – PPO | Admitting: Dietician

## 2013-05-15 ENCOUNTER — Ambulatory Visit: Payer: BC Managed Care – PPO | Admitting: Dietician

## 2013-06-10 ENCOUNTER — Ambulatory Visit (INDEPENDENT_AMBULATORY_CARE_PROVIDER_SITE_OTHER): Payer: BC Managed Care – PPO | Admitting: Surgery

## 2013-06-18 ENCOUNTER — Ambulatory Visit: Payer: BC Managed Care – PPO | Admitting: Dietician

## 2013-07-11 ENCOUNTER — Other Ambulatory Visit: Payer: Self-pay

## 2013-09-11 ENCOUNTER — Other Ambulatory Visit: Payer: Self-pay | Admitting: Internal Medicine

## 2013-09-18 ENCOUNTER — Encounter: Payer: BC Managed Care – PPO | Attending: Surgery | Admitting: Dietician

## 2013-09-18 DIAGNOSIS — Z713 Dietary counseling and surveillance: Secondary | ICD-10-CM | POA: Diagnosis not present

## 2013-09-18 DIAGNOSIS — Z9884 Bariatric surgery status: Secondary | ICD-10-CM | POA: Diagnosis not present

## 2013-09-18 NOTE — Patient Instructions (Addendum)
-  Eat something or drink a protein shake every 3-4 hours -Try to meet 60g protein goal per day -Eat slowly and practice chewing really well -Have any vegetable except corn, peas, and potatoes   TANITA  BODY COMP RESULTS  03/19/13 09/18/13   BMI (kg/m^2) 42.3 37.1   Fat Mass (lbs) 136.0 106.5   Fat Free Mass (lbs) 114.0 116.5   Total Body Water (lbs) 83.5 85.5

## 2013-09-18 NOTE — Progress Notes (Signed)
Follow-up visit:  6 months Post-Operative Gastric sleeve Surgery  Medical Nutrition Therapy:  Appt start time: 1130 end time:  1200.  Primary concerns today: Post-operative Bariatric Surgery Nutrition Management.  "Brenda Bowman" is here today for the first time since December 2014. She also has not seen Dr. Daphine DeutscherMartin since December 2014. She states that she is still trying to get in the habit of eating slowly and chewing well. She tolerates most foods but often does not eat until 2pm.  Preferred Learning Style:   No preference indicated   Learning Readiness:   Ready  Surgery date: 03/04/13 Surgery type: Sleeve [facility] 260 lbs on 01/10/13 Highest weight: 267 lbs per patient Weight today: 223 lbs Weight change: 27 lbs Total weight lost: 44 lbs  TANITA  BODY COMP RESULTS  03/19/13 09/18/13   BMI (kg/m^2) 42.3 37.1   Fat Mass (lbs) 136.0 106.5   Fat Free Mass (lbs) 114.0 116.5   Total Body Water (lbs) 83.5 85.5     24-hr recall: B (AM): coffee with cream and sweetener Snk (AM): none  L (PM): none Snk (2 PM): salad, fruit, protein shake (23g)  D ( PM): squash and SF jello and 5oz salmon and brussels sprouts (35g) Snk (PM):   Fluid intake: water, protein shakes (64+ oz per patient estimate) Estimated total protein intake: unknown  Medications: no longer taking losartan or diuretic or insulin Supplementation: taking  Average CBG per patient: around 100 usually Last patient reported A1c: no recent  Using straws: occasionally Drinking while eating: usually not Hair loss: unknown, taking Biotin Carbonated beverages: diet soda N/V/D/C: some nausea, no vomiting Dumping syndrome: none  Recent physical activity:  Cardio and weights 3x a week, plans to start swimming  Progress Towards Goal(s):  In progress.  Handouts given during visit include:  Phase 3A lean protein   Samples given and patient instructed on proper use: Premier Protein shake (strawberry - qty  1) Lot#: [id number] Exp: 03/2014  Nutritional Diagnosis:  Follansbee-3.3 Overweight/obesity related to past poor dietary habits and physical inactivity as evidenced by patient w/ recent gastric sleeve surgery following dietary guidelines for continued weight loss.     Intervention:  Nutrition counseling provided.  Teaching Method Utilized:  Visual Auditory Hands on  Barriers to learning/adherence to lifestyle change: knowledge deficit related to canceled appointments  Demonstrated degree of understanding via:  Teach Back   Monitoring/Evaluation:  Dietary intake, exercise, and body weight. Follow up in 1 months for 7 month post-op visit.

## 2013-09-24 ENCOUNTER — Encounter: Payer: Self-pay | Admitting: Internal Medicine

## 2013-09-26 ENCOUNTER — Ambulatory Visit (INDEPENDENT_AMBULATORY_CARE_PROVIDER_SITE_OTHER): Payer: BC Managed Care – PPO | Admitting: Surgery

## 2013-10-24 ENCOUNTER — Ambulatory Visit: Payer: BC Managed Care – PPO | Admitting: Dietician

## 2013-10-29 ENCOUNTER — Ambulatory Visit: Payer: BC Managed Care – PPO | Admitting: Dietician

## 2013-11-28 ENCOUNTER — Ambulatory Visit: Payer: BC Managed Care – PPO | Admitting: Internal Medicine

## 2013-12-01 ENCOUNTER — Other Ambulatory Visit: Payer: Self-pay | Admitting: Internal Medicine

## 2014-02-13 ENCOUNTER — Emergency Department (HOSPITAL_COMMUNITY)
Admission: EM | Admit: 2014-02-13 | Discharge: 2014-02-13 | Disposition: A | Discharge status: Home / Self Care | Payer: BC Managed Care – PPO | Source: Home / Self Care | Attending: Family Medicine | Admitting: Family Medicine

## 2014-05-15 ENCOUNTER — Other Ambulatory Visit (HOSPITAL_COMMUNITY): Payer: Self-pay | Admitting: Internal Medicine

## 2014-05-15 DIAGNOSIS — R931 Abnormal findings on diagnostic imaging of heart and coronary circulation: Secondary | ICD-10-CM

## 2014-05-20 ENCOUNTER — Other Ambulatory Visit (HOSPITAL_COMMUNITY): Payer: BC Managed Care – PPO

## 2014-05-22 ENCOUNTER — Ambulatory Visit (HOSPITAL_COMMUNITY)
Admission: RE | Admit: 2014-05-22 | Discharge: 2014-05-22 | Disposition: A | Discharge status: Home / Self Care | Payer: BC Managed Care – PPO | Source: Ambulatory Visit | Attending: Internal Medicine | Admitting: Internal Medicine

## 2014-05-22 DIAGNOSIS — R079 Chest pain, unspecified: Secondary | ICD-10-CM | POA: Insufficient documentation

## 2014-05-22 DIAGNOSIS — E119 Type 2 diabetes mellitus without complications: Secondary | ICD-10-CM | POA: Insufficient documentation

## 2014-05-22 DIAGNOSIS — I10 Essential (primary) hypertension: Secondary | ICD-10-CM | POA: Diagnosis not present

## 2014-05-22 DIAGNOSIS — R931 Abnormal findings on diagnostic imaging of heart and coronary circulation: Secondary | ICD-10-CM

## 2014-05-22 NOTE — Progress Notes (Signed)
  Echocardiogram 2D Echocardiogram has been performed.  Cathie BeamsGREGORY, Cheryl Chay 05/22/2014, 4:09 PM

## 2014-07-03 ENCOUNTER — Other Ambulatory Visit (HOSPITAL_COMMUNITY): Payer: Self-pay | Admitting: Internal Medicine

## 2014-07-03 DIAGNOSIS — Z1231 Encounter for screening mammogram for malignant neoplasm of breast: Secondary | ICD-10-CM

## 2014-07-22 ENCOUNTER — Ambulatory Visit (HOSPITAL_COMMUNITY): Payer: BC Managed Care – PPO | Attending: Internal Medicine

## 2014-09-30 IMAGING — CR DG CHEST 2V
2 series · 2 of 2 positions shown · non-contrast
Comparison: None.

CLINICAL DATA: Chest pain. History of asthma.

EXAM:
CHEST  2 VIEW

[w chest pa]
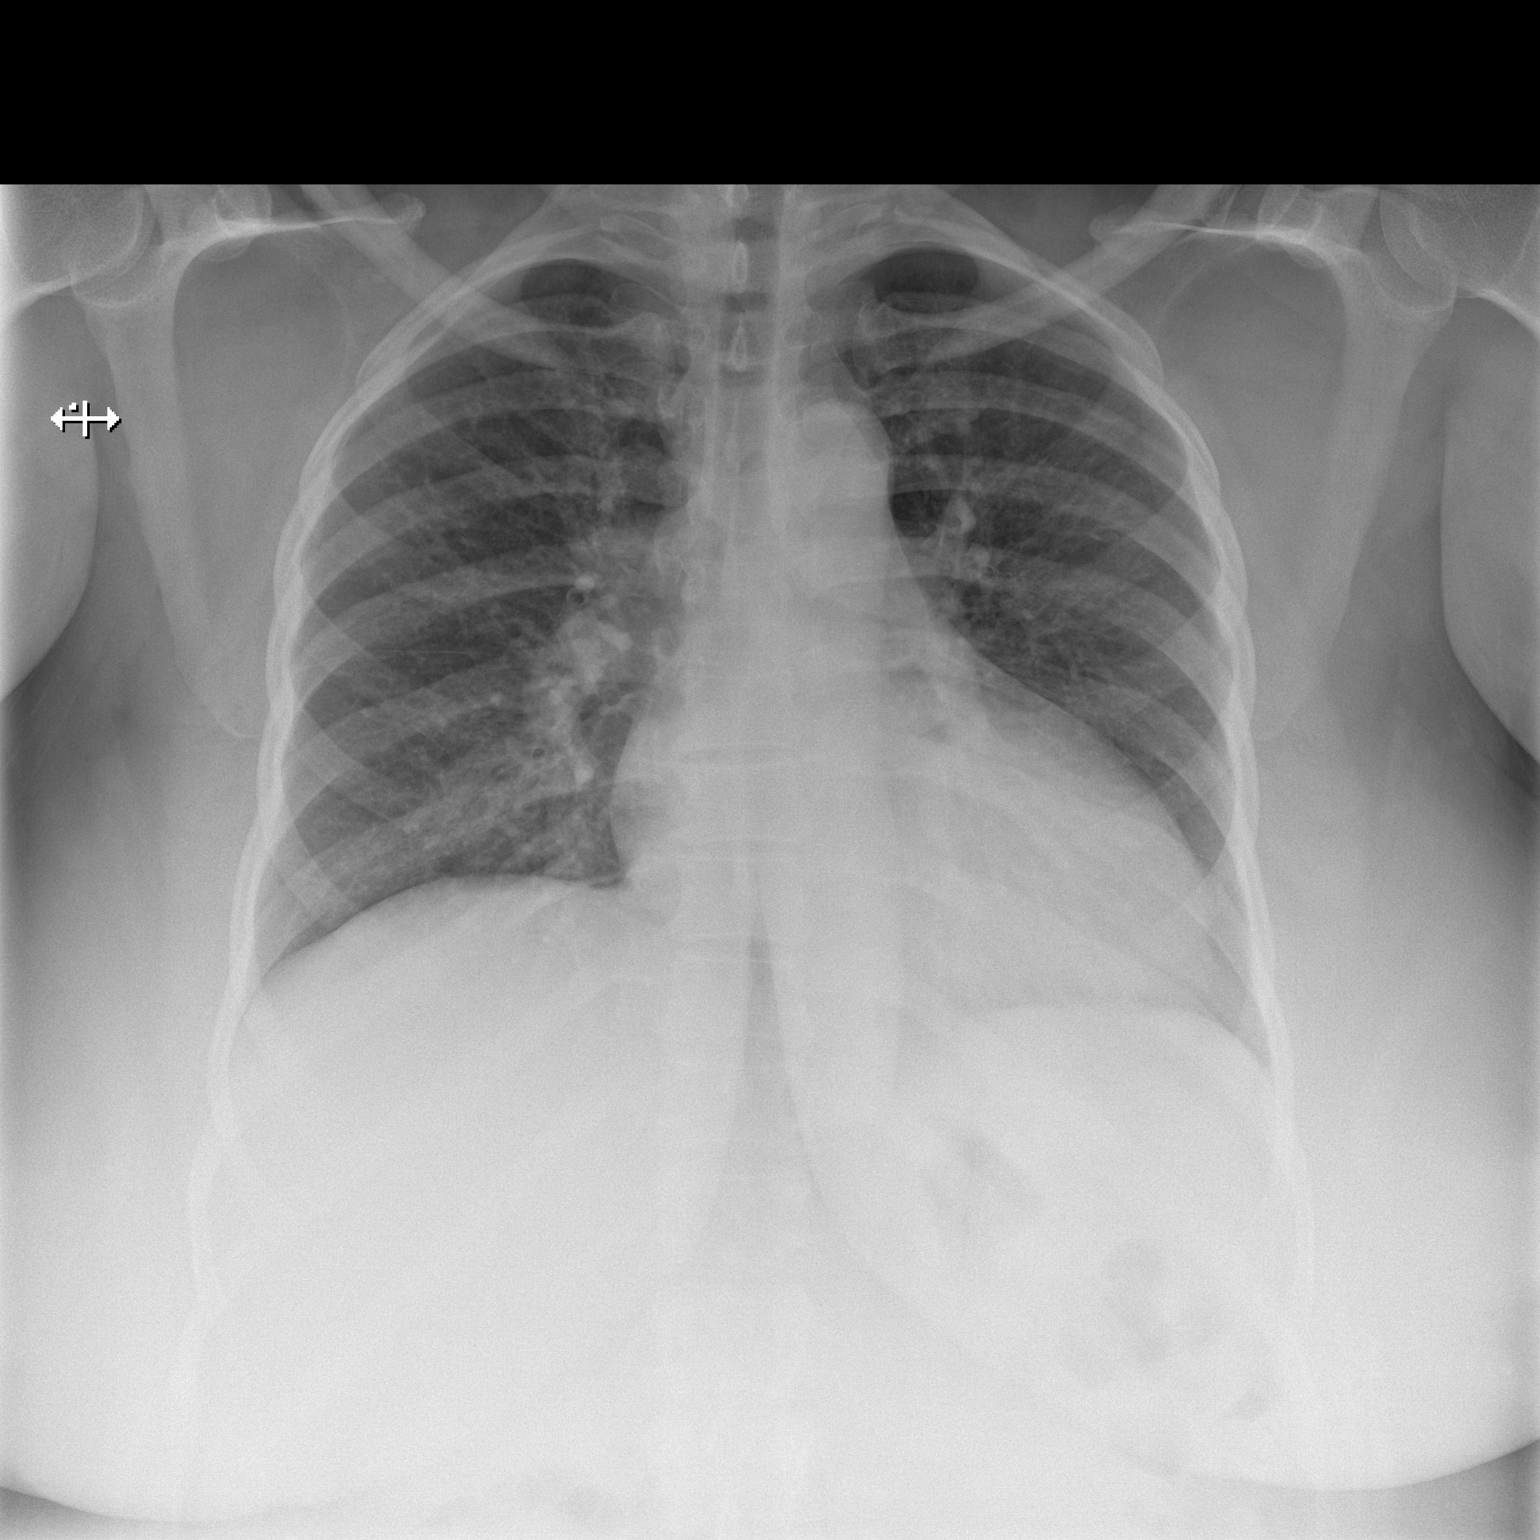

[w chest lat]
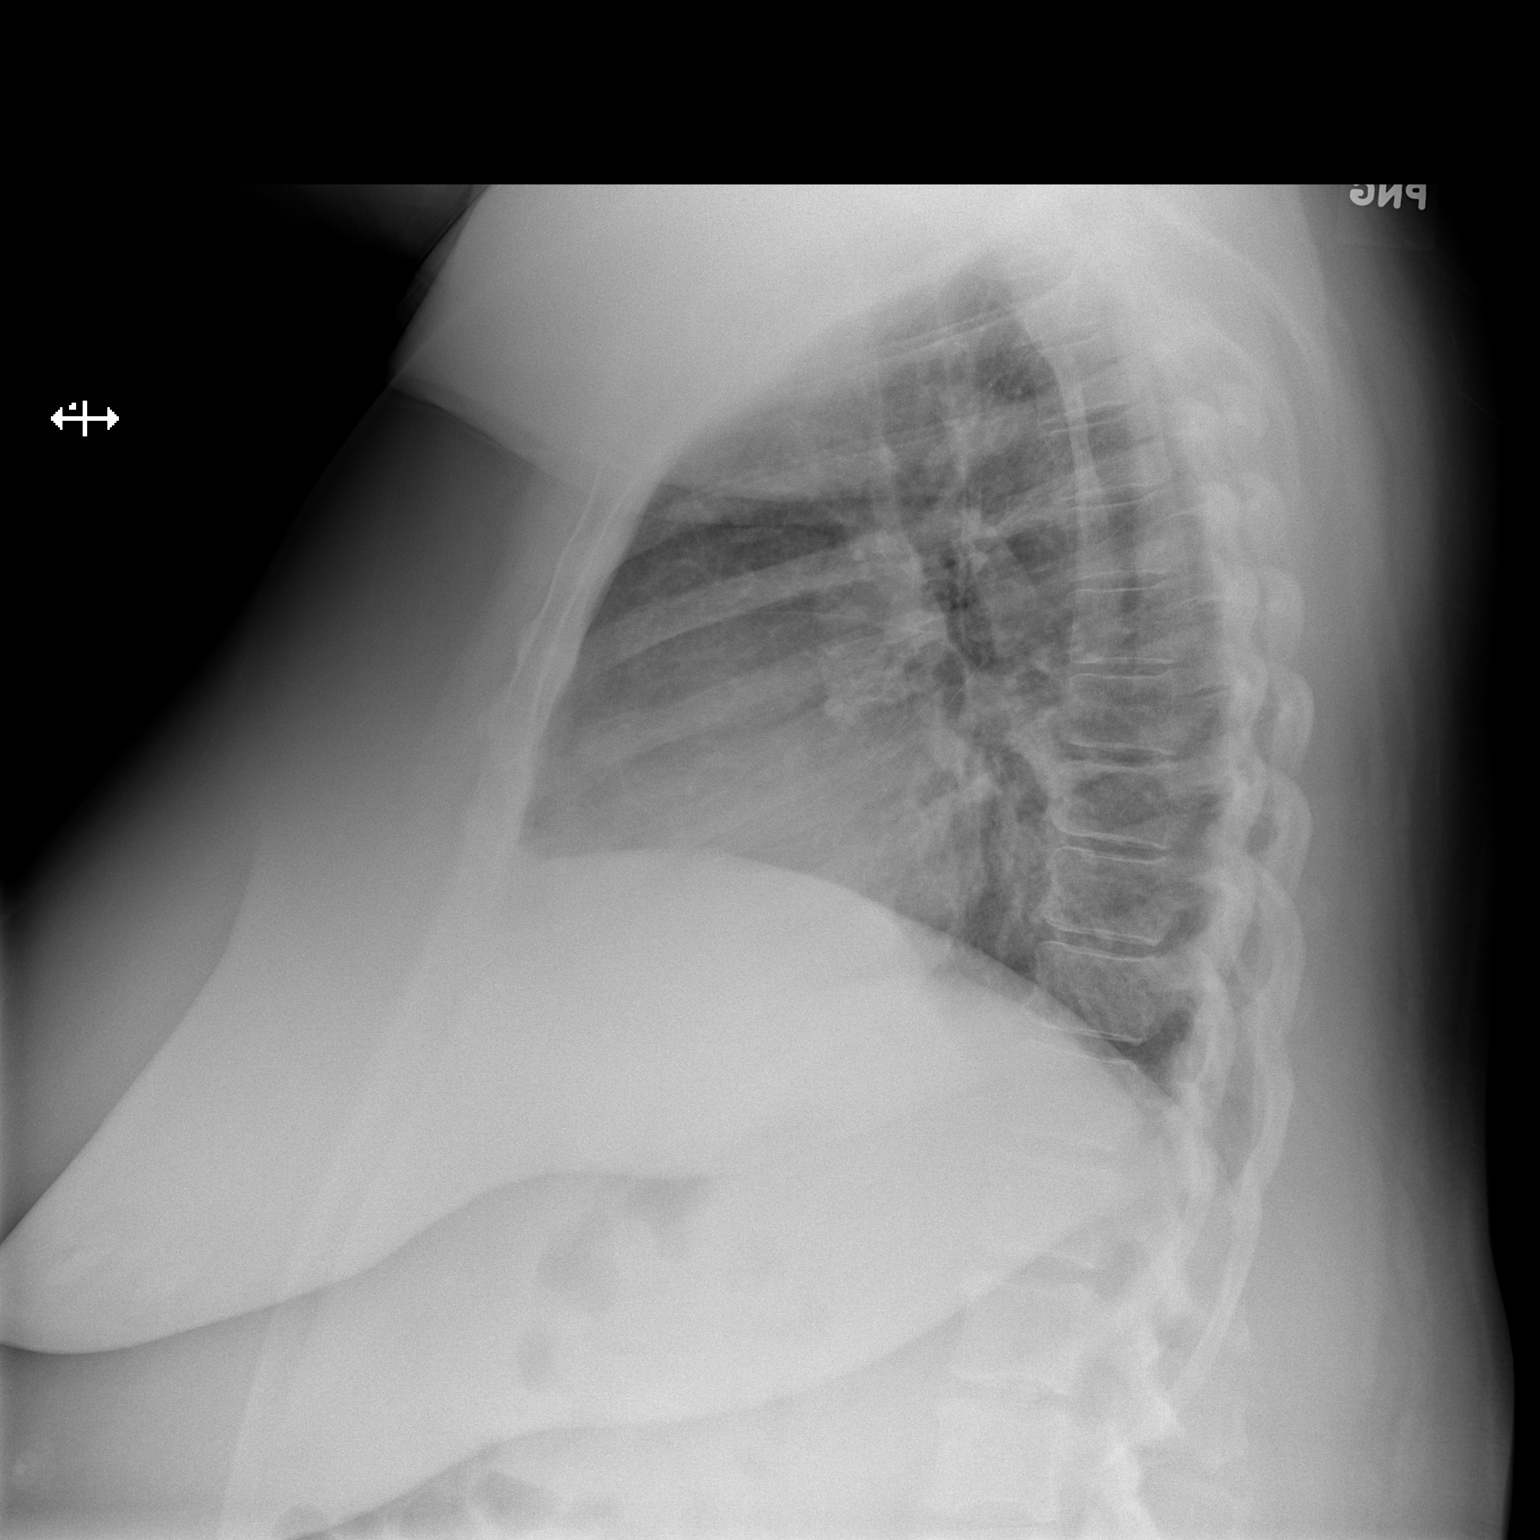

[2 of 2 positions shown; findings below may reference images not displayed]

FINDINGS: The heart is mildly enlarged. The mediastinal and hilar contours are
within normal limits. There is peribronchial thickening and
increased interstitial markings which could be related to reactive
airways disease or bronchitis. No focal infiltrates, edema or
effusions.
IMPRESSION: Bronchitic type lung changes acute versus chronic. No focal
infiltrates.

## 2014-10-15 IMAGING — CR DG CHEST 2V
2 series · 2 of 2 positions shown · non-contrast
Comparison: 12/31/2012

CLINICAL DATA: Morbid obesity. Pre-op evaluation for bariatric
surgery. Diabetes. Hypertension.

EXAM:
CHEST  2 VIEW

[view not recorded (1 of 2)]
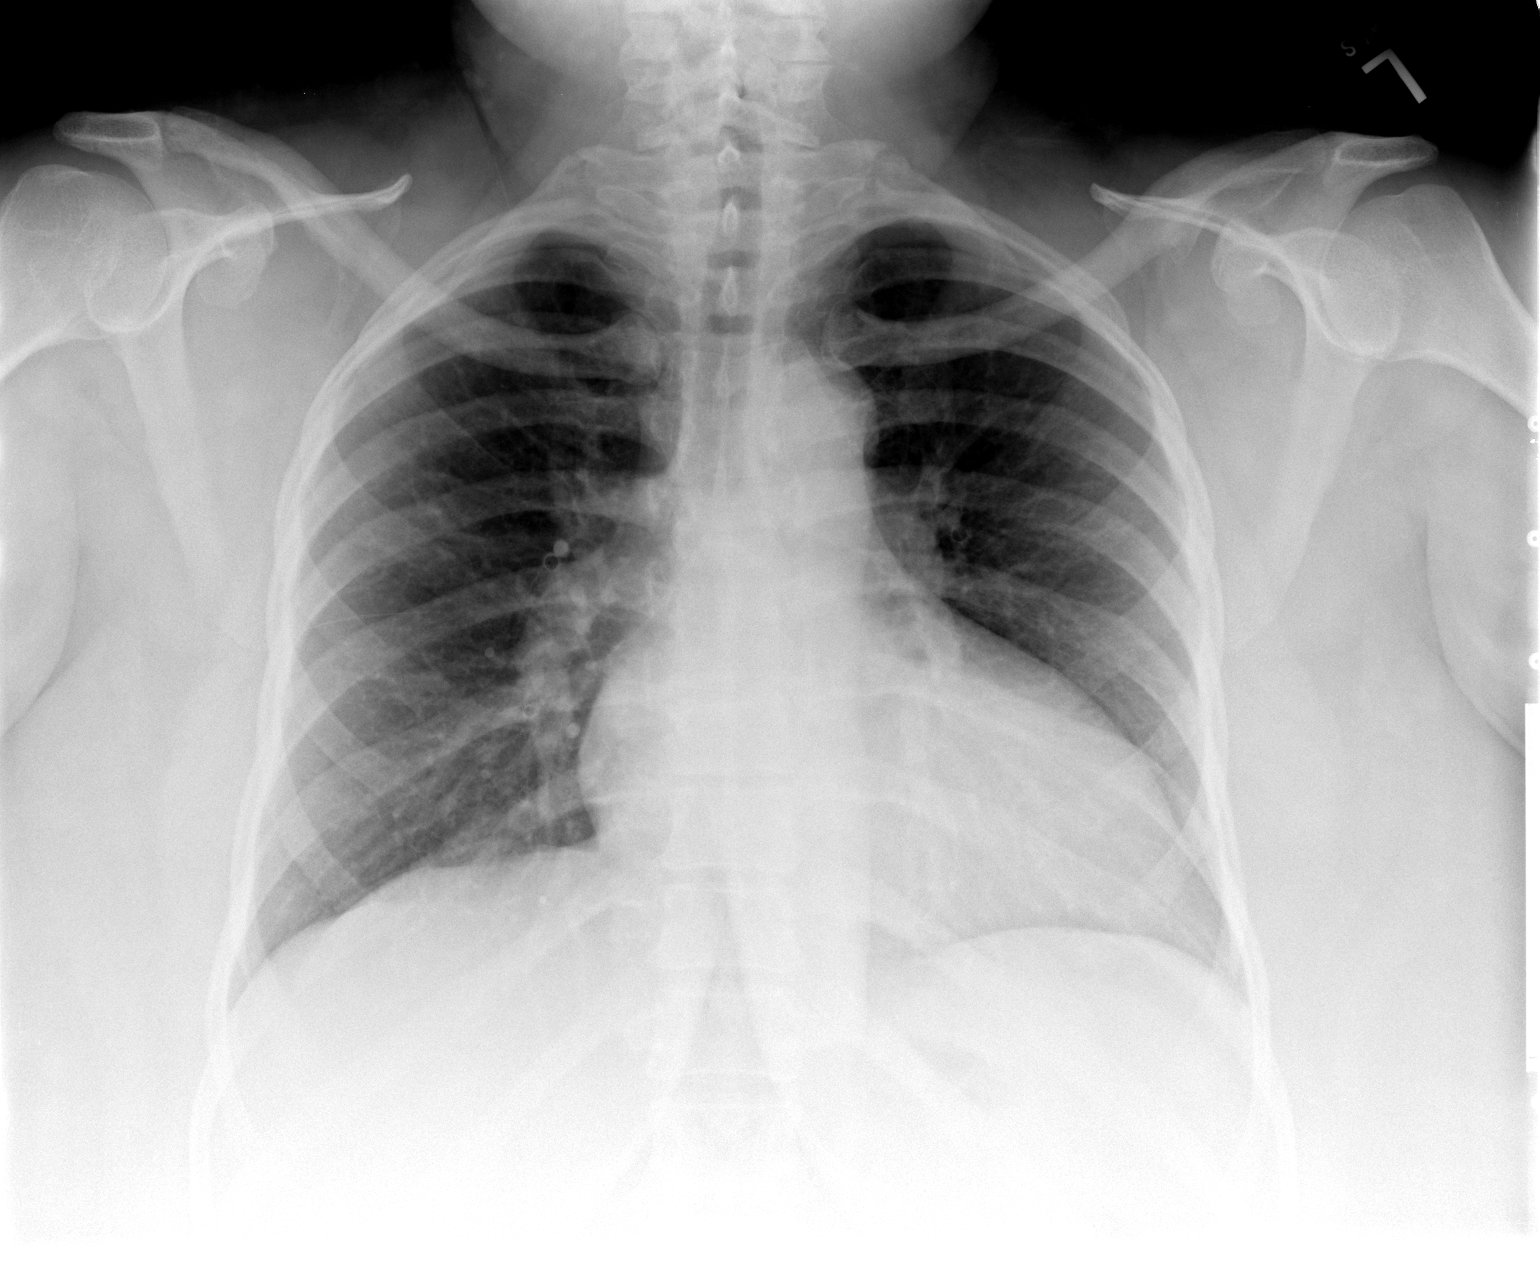

[view not recorded (2 of 2)]
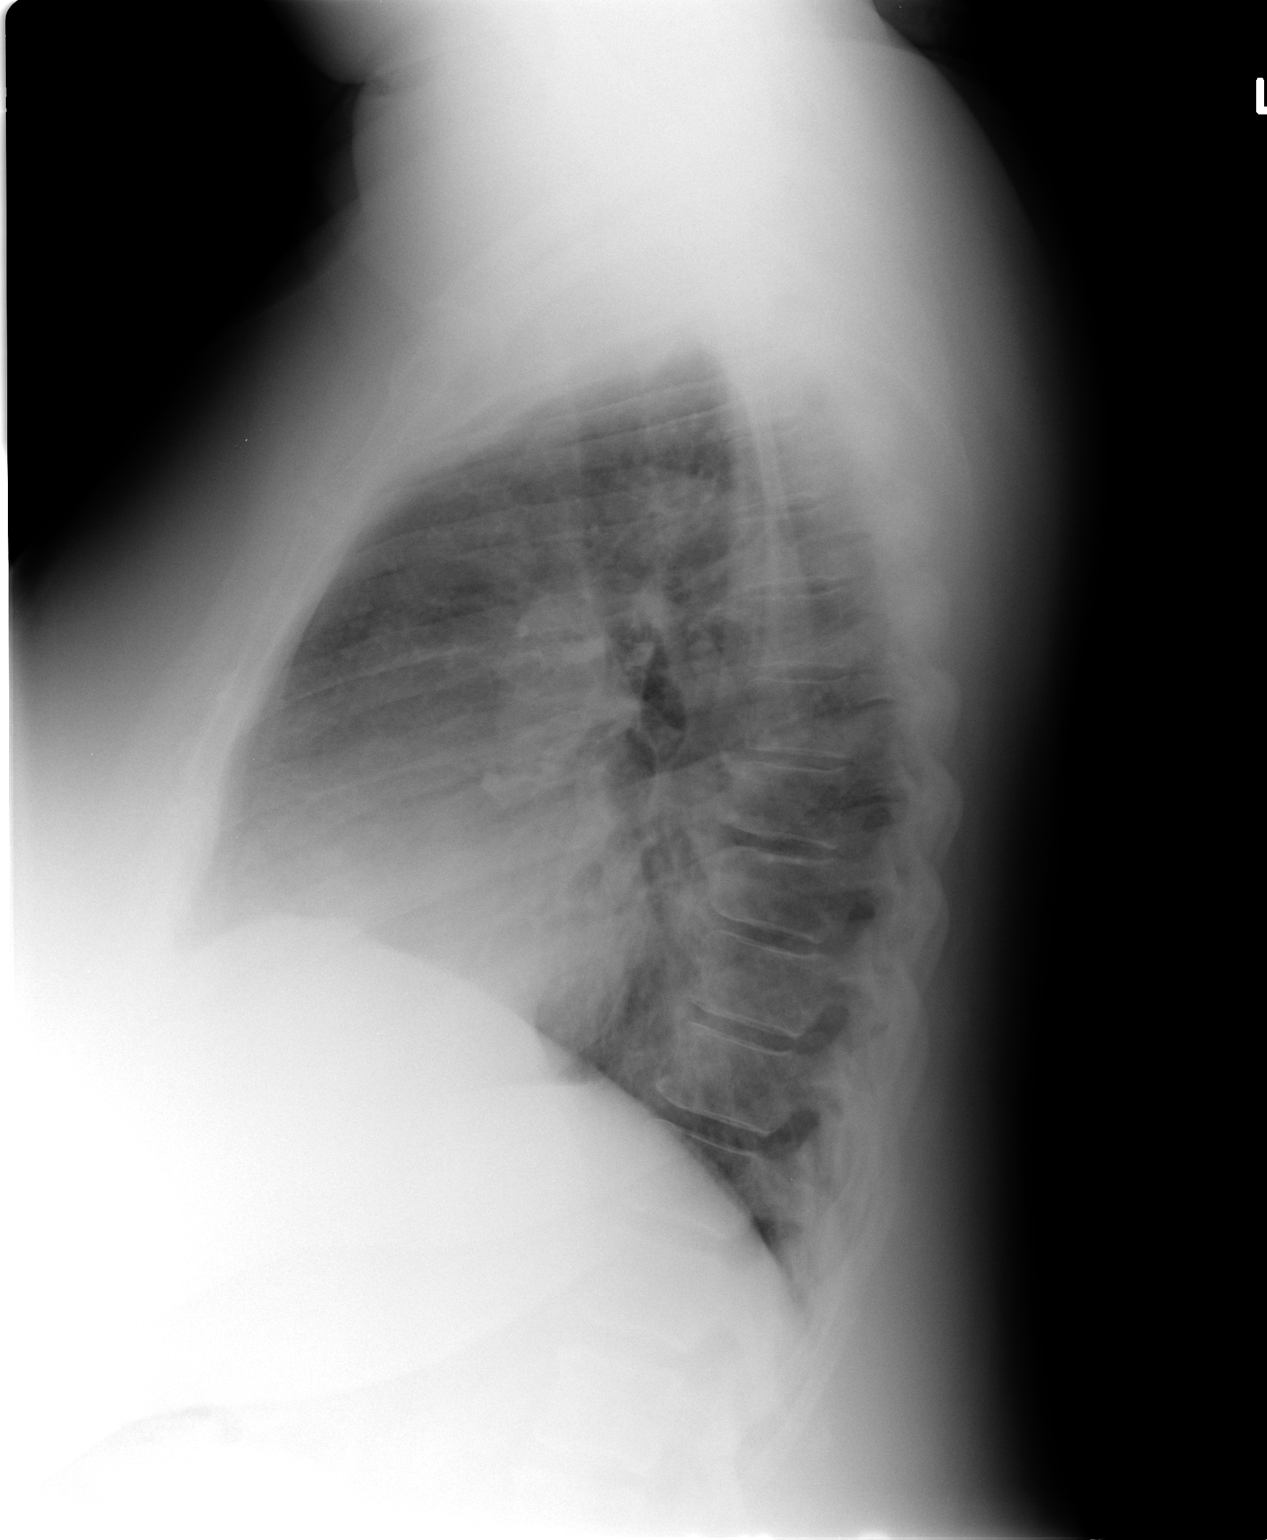

[2 of 2 positions shown; findings below may reference images not displayed]

FINDINGS: Mild cardiomegaly remains stable. Both lungs are clear. No evidence
of pleural effusion. No mass or lymphadenopathy identified.
IMPRESSION: Stable mild cardiomegaly. No active lung disease.

## 2014-10-15 IMAGING — US US ABDOMEN COMPLETE
1 series · 14 of 25 positions shown · non-contrast
Comparison: None.

CLINICAL DATA: Morbid obesity. Pre-op evaluation for bariatric
surgery.

EXAM:
ULTRASOUND ABDOMEN COMPLETE

[Series 1: us abdomen complete · 14 of 64 slices shown]
[im 1/64]
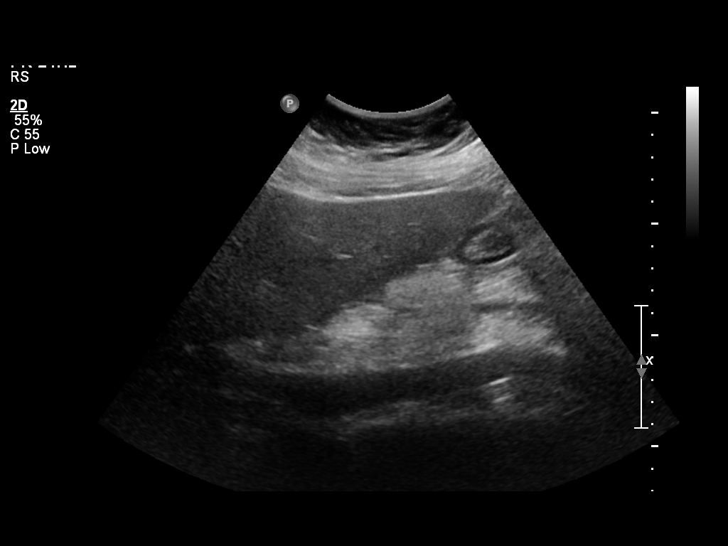
[im 6/64]
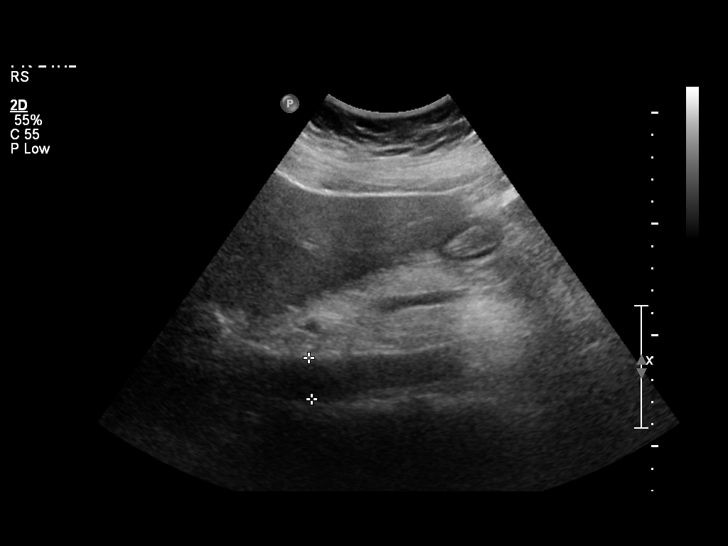
[im 11/64]
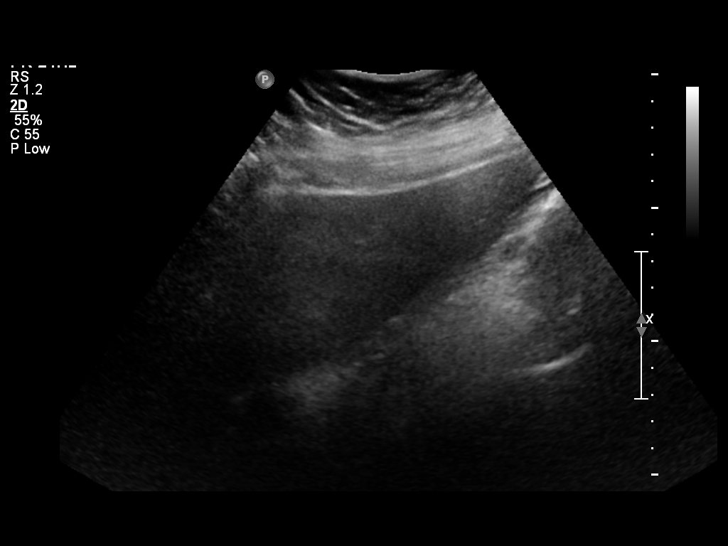
[im 16/64]
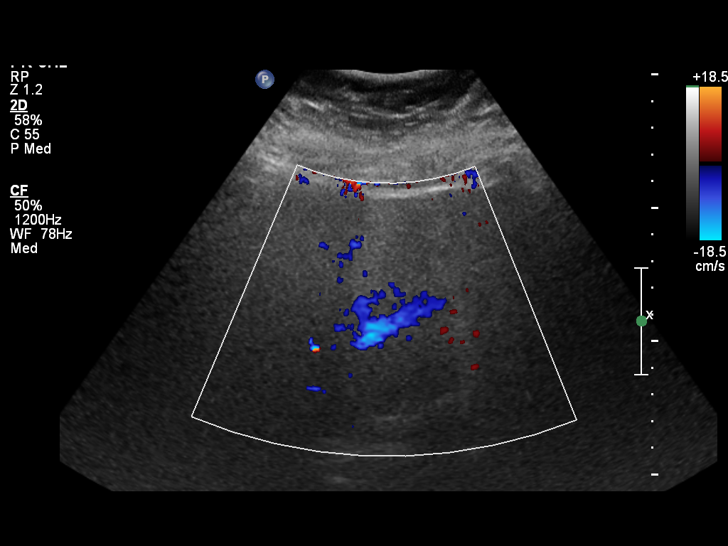
[im 22/64]
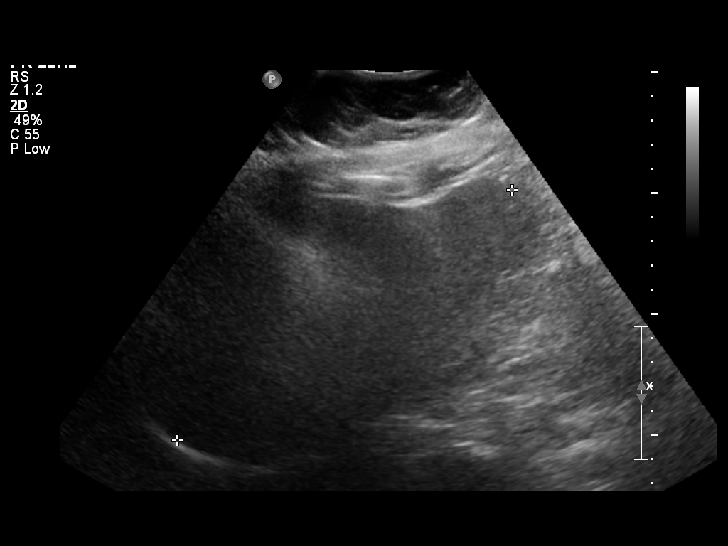
[im 24/64]
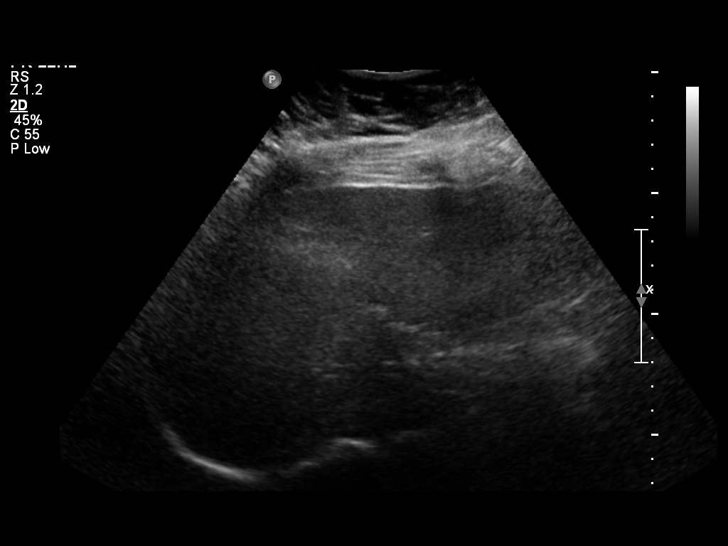
[im 29/64]
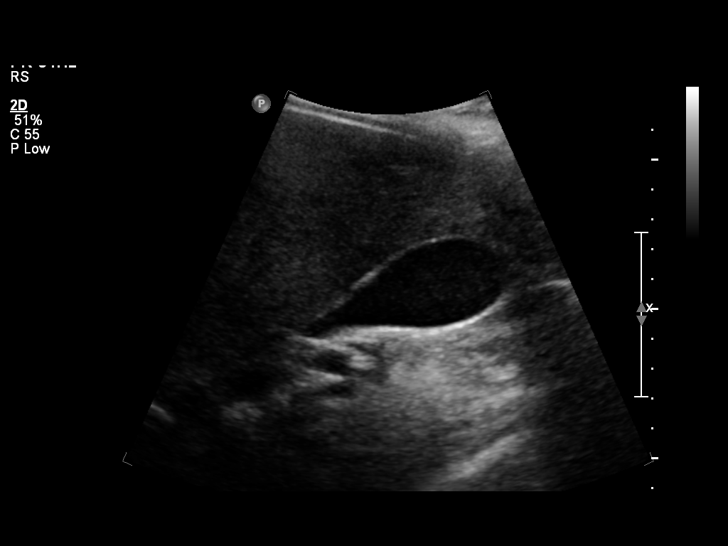
[im 35/64]
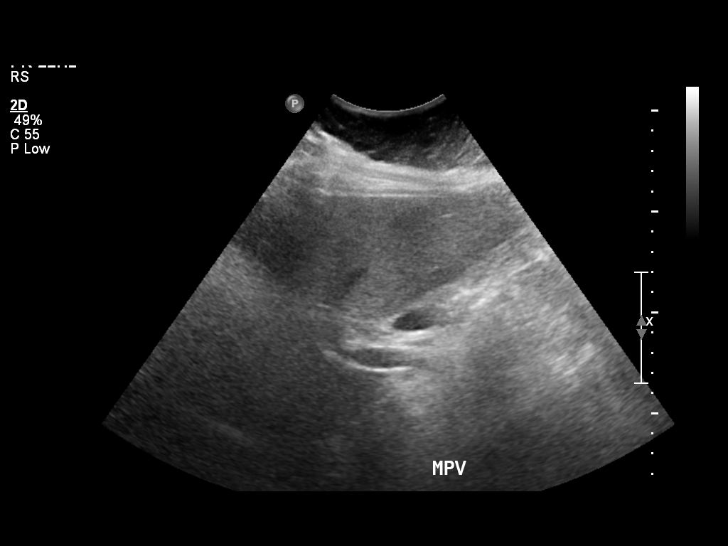
[im 40/64]
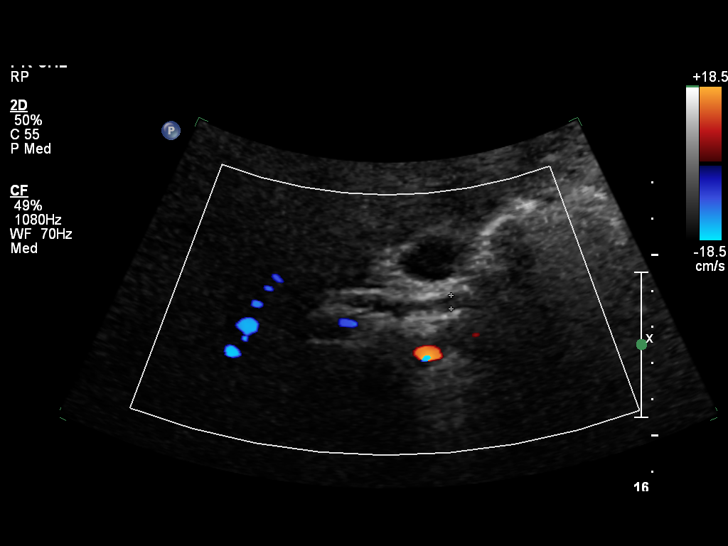
[im 43/64]
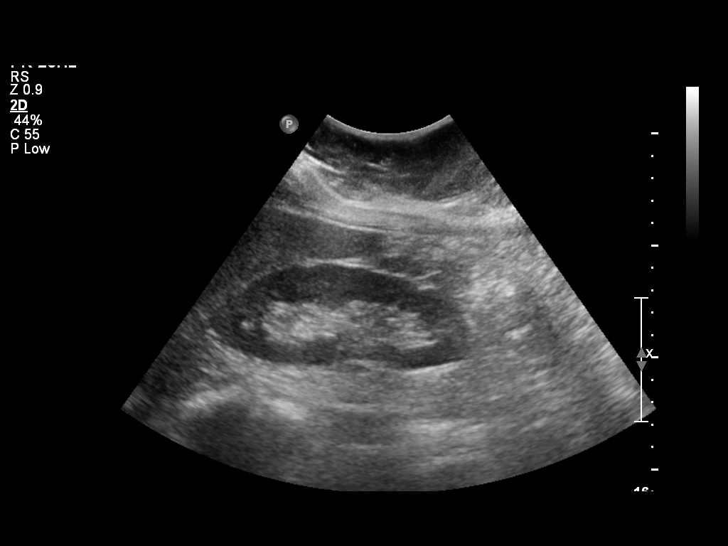
[im 48/64]
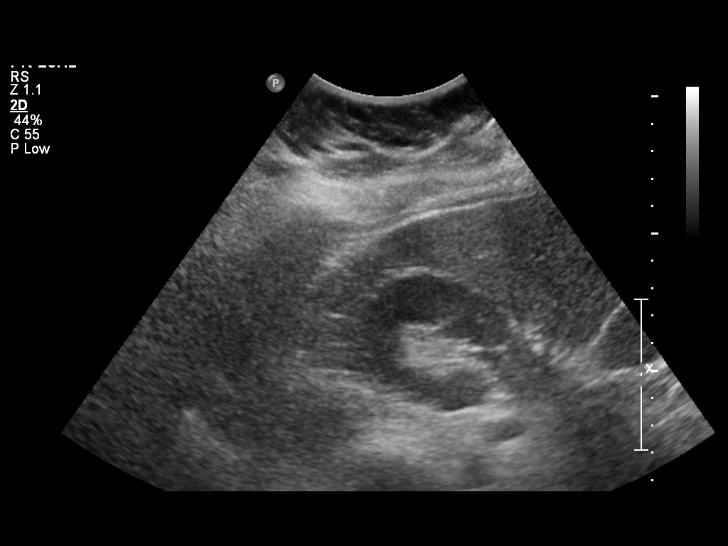
[im 53/64]
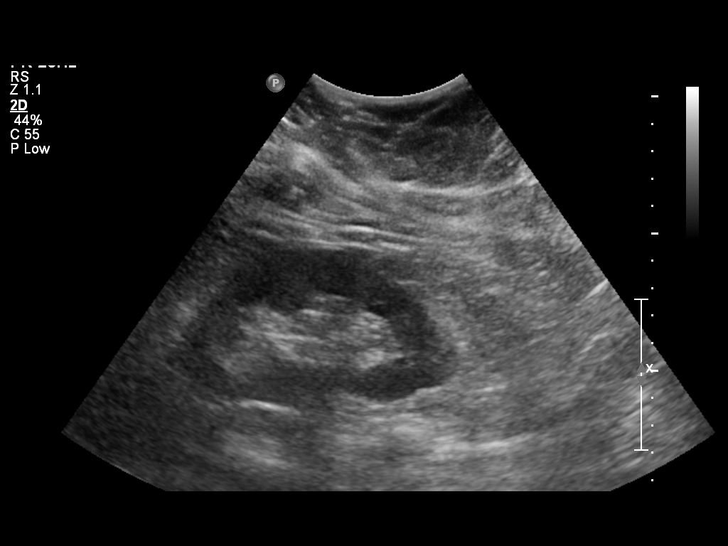
[im 58/64]
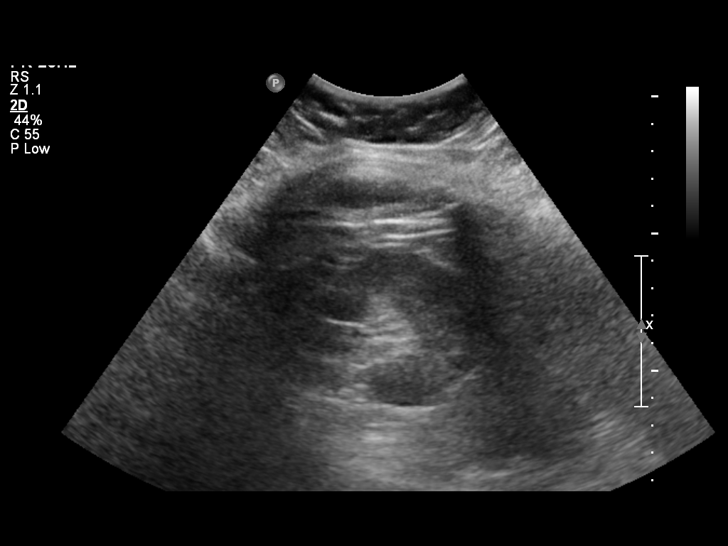
[im 64/64]
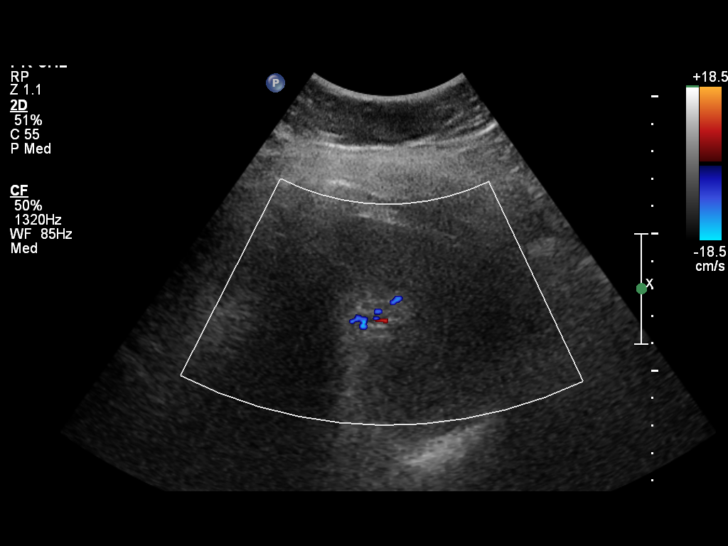

[14 of 25 positions shown; findings below may reference images not displayed]

FINDINGS: Gallbladder

No gallstones or wall thickening. Negative sonographic Murphy's
sign.

Common bile duct

Diameter: 4 mm.

Liver

No focal lesion identified. Mildly increased parenchymal
echogenicity, consistent with mild steatosis

IVC

Not well visualized due to patient habitus.

Pancreas

Visualized portion unremarkable.

Spleen

Size and appearance within normal limits.

Right Kidney

Length: 12.4 cm. Echogenicity within normal limits. No mass or
hydronephrosis visualized.

Left Kidney

Length: 11.3 cm. Echogenicity within normal limits. No mass or
hydronephrosis visualized.

Abdominal aorta

Not well visualized due to overlying bowel gas. Visualized portion
of proximal and mid abdominal aorta is nondilated.
IMPRESSION: No evidence of gallstones or biliary dilatation.

Mild hepatic steatosis.

## 2015-07-26 DIAGNOSIS — I10 Essential (primary) hypertension: Secondary | ICD-10-CM

## 2015-07-26 NOTE — Congregational Nurse Program (Signed)
Congregational Nurse Program Note  Date of Encounter: 07/26/2015  Past Medical History: Past Medical History  Diagnosis Date  . Hypertension   . HLD (hyperlipidemia)   . 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 Anxiety   . Depression   . Malaria 02-20-13    1'90-[place] was tx.  . Diabetes mellitus     oral  and Lantus  . Glaucoma 02-20-13    left eye -eye drop used    Encounter Details:     CNP Questionnaire - 07/26/15 1647    Patient Demographics   Is this a new or existing patient? New   Patient is considered a/an Not Applicable   Race African-American/Black   Patient Assistance   Location of Patient Assistance Not Applicable   Patient's financial/insurance status Private Insurance Coverage   Uninsured Patient No   Food insecurities addressed Not Applicable   Transportation assistance No   Assistance securing medications No   Educational health offerings Hypertension;Diabetes   Encounter Details   Primary purpose of visit Education/Health Concerns;Chronic Illness/Condition Visit   Was an Emergency Department visit averted? Yes   Does patient have a medical provider? Yes   Patient referred to Follow up with established PCP   Was a mental health screening completed? (GAINS tool) No   Does patient have dental issues? No   Does patient have vision issues? No   Since previous encounter, have you referred patient for abnormal blood pressure that resulted in a new diagnosis or medication change? No   Since previous encounter, have you referred patient for abnormal blood glucose that resulted in a new diagnosis or medication change? No   For Abstraction Use Only   Does patient have insurance? Yes       S- Brenda Bowman had her blood pressure and blood sugar [facility] [employer] [facility] [employer] [facility]  O-Her blood pressure was 165/115 in the right arm and 205/110  in her left arm ,blood sugar was 159 A- abnormal blood pressure and blood sugar  P- educated that her blood pressure was high and she was at risk for stroke and she should consult her PCP and check daily .

## 2016-01-05 ENCOUNTER — Encounter (HOSPITAL_COMMUNITY): Payer: Self-pay

## 2016-10-03 ENCOUNTER — Encounter (HOSPITAL_COMMUNITY): Payer: Self-pay

## 2017-06-15 ENCOUNTER — Ambulatory Visit: Payer: BC Managed Care – PPO | Attending: Audiology | Admitting: Audiology

## 2017-10-23 ENCOUNTER — Other Ambulatory Visit: Payer: Self-pay | Admitting: Surgery

## 2017-10-23 DIAGNOSIS — Z903 Acquired absence of stomach [part of]: Secondary | ICD-10-CM

## 2017-10-25 ENCOUNTER — Other Ambulatory Visit: Payer: Self-pay | Admitting: Surgery

## 2017-10-25 ENCOUNTER — Ambulatory Visit
Admission: RE | Admit: 2017-10-25 | Discharge: 2017-10-25 | Disposition: A | Discharge status: Home / Self Care | Payer: BLUE CROSS/BLUE SHIELD | Source: Ambulatory Visit | Attending: Surgery | Admitting: Surgery

## 2017-10-25 DIAGNOSIS — Z903 Acquired absence of stomach [part of]: Secondary | ICD-10-CM

## 2018-01-28 ENCOUNTER — Emergency Department (HOSPITAL_COMMUNITY)
Admission: EM | Admit: 2018-01-28 | Discharge: 2018-01-28 | Disposition: A | Discharge status: Home / Self Care | Payer: BLUE CROSS/BLUE SHIELD | Attending: Emergency Medicine | Admitting: Emergency Medicine

## 2018-01-28 ENCOUNTER — Other Ambulatory Visit: Payer: Self-pay

## 2018-01-28 ENCOUNTER — Encounter (HOSPITAL_COMMUNITY): Payer: Self-pay

## 2018-01-28 DIAGNOSIS — R51 Headache: Secondary | ICD-10-CM | POA: Insufficient documentation

## 2018-01-28 DIAGNOSIS — Z5321 Procedure and treatment not carried out due to patient leaving prior to being seen by health care provider: Secondary | ICD-10-CM | POA: Insufficient documentation
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
## 2018-01-28 NOTE — ED Notes (Signed)
Patient left without being seen by provider.
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
